# Patient Record
Sex: Female | Born: 1995 | Race: Black or African American | Hispanic: No | Marital: Single | Smoking: Never smoker
Health system: Southern US, Community
[De-identification: ages and names within clinical notes are randomized; demographics above are authoritative.]

## PROBLEM LIST (undated history)

## (undated) DIAGNOSIS — G43909 Migraine, unspecified, not intractable, without status migrainosus: Secondary | ICD-10-CM

## (undated) DIAGNOSIS — G932 Benign intracranial hypertension: Secondary | ICD-10-CM

## (undated) DIAGNOSIS — B977 Papillomavirus as the cause of diseases classified elsewhere: Secondary | ICD-10-CM

## (undated) HISTORY — DX: Benign intracranial hypertension: G93.2

## (undated) HISTORY — PX: DILATION AND CURETTAGE OF UTERUS: SHX78

## (undated) HISTORY — PX: KNEE SURGERY: SHX244

## (undated) HISTORY — DX: Papillomavirus as the cause of diseases classified elsewhere: B97.7

---
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
## 2015-04-05 ENCOUNTER — Emergency Department (HOSPITAL_COMMUNITY)
Admission: EM | Admit: 2015-04-05 | Discharge: 2015-04-05 | Disposition: A | Discharge status: Home / Self Care | Payer: BLUE CROSS/BLUE SHIELD | Attending: Emergency Medicine | Admitting: Emergency Medicine

## 2015-04-05 ENCOUNTER — Emergency Department (HOSPITAL_COMMUNITY): Payer: BLUE CROSS/BLUE SHIELD

## 2015-04-05 ENCOUNTER — Encounter (HOSPITAL_COMMUNITY): Payer: Self-pay

## 2015-04-05 DIAGNOSIS — R1013 Epigastric pain: Secondary | ICD-10-CM | POA: Diagnosis present

## 2015-04-05 DIAGNOSIS — Z3202 Encounter for pregnancy test, result negative: Secondary | ICD-10-CM | POA: Diagnosis not present

## 2015-04-05 DIAGNOSIS — A084 Viral intestinal infection, unspecified: Secondary | ICD-10-CM | POA: Diagnosis not present

## 2015-04-05 DIAGNOSIS — Z791 Long term (current) use of non-steroidal anti-inflammatories (NSAID): Secondary | ICD-10-CM | POA: Insufficient documentation

## 2015-04-05 DIAGNOSIS — Z880 Allergy status to penicillin: Secondary | ICD-10-CM | POA: Diagnosis not present

## 2015-04-05 DIAGNOSIS — R109 Unspecified abdominal pain: Secondary | ICD-10-CM

## 2015-04-05 LAB — COMPREHENSIVE METABOLIC PANEL
ALK PHOS: 84 U/L (ref 38–126)
ALT: 18 U/L (ref 14–54)
AST: 22 U/L (ref 15–41)
Albumin: 3.9 g/dL (ref 3.5–5.0)
Anion gap: 8 (ref 5–15)
BUN: 10 mg/dL (ref 6–20)
CHLORIDE: 107 mmol/L (ref 101–111)
CO2: 23 mmol/L (ref 22–32)
CREATININE: 0.69 mg/dL (ref 0.44–1.00)
Calcium: 9.3 mg/dL (ref 8.9–10.3)
GFR calc Af Amer: >60 mL/min (ref >60)
Glucose, Bld: 111 mg/dL — ABNORMAL HIGH (ref 65–99)
Potassium: 3.8 mmol/L (ref 3.5–5.1)
Sodium: 138 mmol/L (ref 135–145)
Total Bilirubin: 0.6 mg/dL (ref 0.3–1.2)
Total Protein: 8.1 g/dL (ref 6.5–8.1)

## 2015-04-05 LAB — I-STAT BETA HCG BLOOD, ED (MC, WL, AP ONLY)

## 2015-04-05 LAB — CBC
HCT: 34.6 % — ABNORMAL LOW (ref 36.0–46.0)
Hemoglobin: 12 g/dL (ref 12.0–15.0)
MCH: 29.9 pg (ref 26.0–34.0)
MCHC: 34.7 g/dL (ref 30.0–36.0)
MCV: 86.1 fL (ref 78.0–100.0)
PLATELETS: 335 10*3/uL (ref 150–400)
RBC: 4.02 MIL/uL (ref 3.87–5.11)
RDW: 11.8 % (ref 11.5–15.5)
WBC: 16.8 10*3/uL — AB (ref 4.0–10.5)

## 2015-04-05 LAB — URINALYSIS, ROUTINE W REFLEX MICROSCOPIC
BILIRUBIN URINE: NEGATIVE
Glucose, UA: NEGATIVE mg/dL
HGB URINE DIPSTICK: NEGATIVE
Ketones, ur: NEGATIVE mg/dL
Nitrite: NEGATIVE
PROTEIN: NEGATIVE mg/dL
Specific Gravity, Urine: 1.022 (ref 1.005–1.030)
pH: 6 (ref 5.0–8.0)

## 2015-04-05 LAB — URINE MICROSCOPIC-ADD ON: RBC / HPF: NONE SEEN RBC/hpf (ref 0–5)

## 2015-04-05 LAB — LIPASE, BLOOD: LIPASE: 21 U/L (ref 11–51)

## 2015-04-05 MED ORDER — ONDANSETRON 8 MG PO TBDP: ORAL_TABLET | ORAL | Status: DC

## 2015-04-05 MED ORDER — ONDANSETRON HCL 4 MG/2ML IJ SOLN
4.0000 mg | Freq: Once | INTRAMUSCULAR | Status: AC
  Administered 2015-04-05: 4 mg via INTRAVENOUS
  Filled 2015-04-05: qty 2

## 2015-04-05 MED ORDER — ONDANSETRON 4 MG PO TBDP
4.0000 mg | ORAL_TABLET | Freq: Once | ORAL | Status: AC | PRN
  Administered 2015-04-05: 4 mg via ORAL
  Filled 2015-04-05: qty 1

## 2015-04-05 MED ORDER — KETOROLAC TROMETHAMINE 30 MG/ML IJ SOLN
30.0000 mg | Freq: Once | INTRAMUSCULAR | Status: AC
  Administered 2015-04-05: 30 mg via INTRAVENOUS
  Filled 2015-04-05: qty 1

## 2015-04-05 MED ORDER — SODIUM CHLORIDE 0.9 % IV BOLUS (SEPSIS)
1000.0000 mL | Freq: Once | INTRAVENOUS | Status: AC
  Administered 2015-04-05: 1000 mL via INTRAVENOUS

## 2015-04-05 NOTE — ED Provider Notes (Signed)
CSN: [id number]     Arrival date & time 04/05/15  0123 History  By signing my name below, I, Marisue HumbleMichelle Chaffee, attest that this documentation has been prepared under the direction and in the presence of Geoffery Lyonsouglas Abbegail Matuska, MD . Electronically Signed: Marisue HumbleMichelle Chaffee, Scribe. 04/05/2015. 3:20 AM.   Chief Complaint  Patient presents with  . Nausea  . Emesis  . Abdominal Pain   The history is provided by the patient. No language interpreter was used.   HPI Comments:  Tiffany [name] is a 20 y.o. female with no pertinent PMHx who presents to the Emergency Department complaining of worsening epigastric pain and non-bloody emesis onset yesterday morning. Pt states she ate at [employer] and then started feeling bad. She returned from spring break two days ago. No alleviating factors noted or treatments attempted PTA. Pt reports sick contact with similar symptoms. Pt denies diarrhea, abdominal surgeries, or any major medical problems.  History reviewed. No pertinent past medical history. History reviewed. No pertinent past surgical history. No family history on file. Social History  Substance Use Topics  . Smoking status: Never Smoker   . Smokeless tobacco: None  . Alcohol Use: No   OB History    No data available     Review of Systems  Constitutional: Negative for fever.  Gastrointestinal: Positive for nausea, vomiting and abdominal pain. Negative for diarrhea.  All other systems reviewed and are negative.  Allergies  Penicillins  Home Medications   Prior to Admission medications   Medication Sig Start Date End Date Taking? Authorizing Provider  diclofenac (CATAFLAM) 50 MG tablet Take 1 tablet (50 mg total) by mouth 3 (three) times daily. One tablet TID with food prn pain. 10/05/14   Hayden Rasmussenavid Mabe, NP   BP 149/109 mmHg  Pulse 90  Temp(Src) 98.3 F (36.8 C) (Oral)  Resp 16  SpO2 100% Physical Exam  Constitutional: She appears well-developed and well-nourished. No distress.   HENT:  Head: Normocephalic and atraumatic.  Mouth/Throat: Oropharynx is clear and moist. No oropharyngeal exudate.  Eyes: Conjunctivae and EOM are normal. Pupils are equal, round, and reactive to light. Right eye exhibits no discharge. Left eye exhibits no discharge. No scleral icterus.  Neck: Normal range of motion. Neck supple. No JVD present. No thyromegaly present.  Cardiovascular: Normal rate, regular rhythm, normal heart sounds and intact distal pulses.  Exam reveals no gallop and no friction rub.   No murmur heard. Pulmonary/Chest: Effort normal and breath sounds normal. No respiratory distress. She has no wheezes. She has no rales.  Abdominal: Soft. Bowel sounds are normal. She exhibits no distension and no mass. There is tenderness. There is no rebound and no guarding.  TTP of epigastric region as well as RUQ and LUQ.  Musculoskeletal: Normal range of motion. She exhibits no edema or tenderness.  Lymphadenopathy:    She has no cervical adenopathy.  Neurological: She is alert. Coordination normal.  Skin: Skin is warm and dry. No rash noted. No erythema.  Psychiatric: She has a normal mood and affect. Her behavior is normal.  Nursing note and vitals reviewed.  ED Course  Procedures  DIAGNOSTIC STUDIES:  Oxygen Saturation is 100% on RA, normal by my interpretation.    COORDINATION OF CARE:  3:05 AM Will administer fluids, nausea medication, and anti-inflammatory. Will order US to r/o gallstones. Discussed treatment plan with pt at bedside and pt agreed to plan.  Labs Review Labs Reviewed  COMPREHENSIVE METABOLIC PANEL - Abnormal; Notable for the  following:    Glucose, Bld 111 (*)    All other components within normal limits  CBC - Abnormal; Notable for the following:    WBC 16.8 (*)    HCT 34.6 (*)    All other components within normal limits  LIPASE, BLOOD  URINALYSIS, ROUTINE W REFLEX MICROSCOPIC (NOT AT [facility])  I-STAT BETA HCG BLOOD, ED ([facility], [facility], [facility] ONLY)    Imaging  Review No results found. I have personally reviewed and evaluated these images and lab results as part of my medical decision-making.    MDM   Final diagnoses:  None    Patient presents with nausea, vomiting, and upper abdominal pain that started earlier today. Her presentation, exam, and workup are most consistent with a viral gastroenteritis. Ultrasound fails to reveal gallstones or other pathology. She is feeling better after fluids and medications and will be discharged to home.  I personally performed the services described in this documentation, which was scribed in my presence. The recorded information has been reviewed and is accurate.       Geoffery Lyons, MD 04/05/15 [phone]

## 2015-04-05 NOTE — ED Notes (Signed)
Bed: WA05 Expected date:  Expected time:  Means of arrival:  Comments: EMS 

## 2015-04-05 NOTE — Discharge Instructions (Signed)
Zofran as prescribed as needed for nausea.  Return to the emergency department if you develop worsening abdominal pain, high fever, bloody stool, or other new and concerning symptoms.   Viral Gastroenteritis Viral gastroenteritis is also known as stomach flu. This condition affects the stomach and intestinal tract. It can cause sudden diarrhea and vomiting. The illness typically lasts 3 to 8 days. Most people develop an immune response that eventually gets rid of the virus. While this natural response develops, the virus can make you quite ill. CAUSES  Many different viruses can cause gastroenteritis, such as rotavirus or noroviruses. You can catch one of these viruses by consuming contaminated food or water. You may also catch a virus by sharing utensils or other personal items with an infected person or by touching a contaminated surface. SYMPTOMS  The most common symptoms are diarrhea and vomiting. These problems can cause a severe loss of body fluids (dehydration) and a body salt (electrolyte) imbalance. Other symptoms may include:  Fever.  Headache.  Fatigue.  Abdominal pain. DIAGNOSIS  Your caregiver can usually diagnose viral gastroenteritis based on your symptoms and a physical exam. A stool sample may also be taken to test for the presence of viruses or other infections. TREATMENT  This illness typically goes away on its own. Treatments are aimed at rehydration. The most serious cases of viral gastroenteritis involve vomiting so severely that you are not able to keep fluids down. In these cases, fluids must be given through an intravenous line (IV). HOME CARE INSTRUCTIONS   Drink enough fluids to keep your urine clear or pale yellow. Drink small amounts of fluids frequently and increase the amounts as tolerated.  Ask your caregiver for specific rehydration instructions.  Avoid:  Foods high in sugar.  Alcohol.  Carbonated drinks.  Tobacco.  Juice.  Caffeine  drinks.  Extremely hot or cold fluids.  Fatty, greasy foods.  Too much intake of anything at one time.  Dairy products until 24 to 48 hours after diarrhea stops.  You may consume probiotics. Probiotics are active cultures of beneficial bacteria. They may lessen the amount and number of diarrheal stools in adults. Probiotics can be found in yogurt with active cultures and in supplements.  Wash your hands well to avoid spreading the virus.  Only take over-the-counter or prescription medicines for pain, discomfort, or fever as directed by your caregiver. Do not give aspirin to children. Antidiarrheal medicines are not recommended.  Ask your caregiver if you should continue to take your regular prescribed and over-the-counter medicines.  Keep all follow-up appointments as directed by your caregiver. SEEK IMMEDIATE MEDICAL CARE IF:   You are unable to keep fluids down.  You do not urinate at least once every 6 to 8 hours.  You develop shortness of breath.  You notice blood in your stool or vomit. This may look like coffee grounds.  You have abdominal pain that increases or is concentrated in one small area (localized).  You have persistent vomiting or diarrhea.  You have a fever.  The patient is a child younger than 3 months, and he or she has a fever.  The patient is a child older than 3 months, and he or she has a fever and persistent symptoms.  The patient is a child older than 3 months, and he or she has a fever and symptoms suddenly get worse.  The patient is a baby, and he or she has no tears when crying. MAKE SURE YOU:   Understand  these instructions.  Will watch your condition.  Will get help right away if you are not doing well or get worse.   This information is not intended to replace advice given to you by your health care provider. Make sure you discuss any questions you have with your health care provider.   Document Released: 01/02/2005 Document Revised:  03/27/2011 Document Reviewed: 10/19/2010 Elsevier Interactive Patient Education [employer]

## 2015-04-05 NOTE — ED Notes (Signed)
Pt c/o N/V and abdominal pain that she suspects might be secondary to food poisoning.

## 2016-03-22 ENCOUNTER — Encounter (HOSPITAL_COMMUNITY): Payer: Self-pay

## 2016-03-22 ENCOUNTER — Inpatient Hospital Stay (HOSPITAL_COMMUNITY)
Admission: AD | Admit: 2016-03-22 | Discharge: 2016-03-22 | Disposition: A | Discharge status: Home / Self Care | Payer: No Typology Code available for payment source | Source: Ambulatory Visit | Attending: Obstetrics & Gynecology | Admitting: Obstetrics & Gynecology

## 2016-03-22 DIAGNOSIS — R109 Unspecified abdominal pain: Secondary | ICD-10-CM | POA: Diagnosis present

## 2016-03-22 DIAGNOSIS — Z3202 Encounter for pregnancy test, result negative: Secondary | ICD-10-CM | POA: Diagnosis not present

## 2016-03-22 DIAGNOSIS — R102 Pelvic and perineal pain: Secondary | ICD-10-CM | POA: Diagnosis not present

## 2016-03-22 DIAGNOSIS — R112 Nausea with vomiting, unspecified: Secondary | ICD-10-CM | POA: Insufficient documentation

## 2016-03-22 DIAGNOSIS — Z880 Allergy status to penicillin: Secondary | ICD-10-CM | POA: Insufficient documentation

## 2016-03-22 LAB — URINALYSIS, ROUTINE W REFLEX MICROSCOPIC
BACTERIA UA: NONE SEEN
BILIRUBIN URINE: NEGATIVE
Glucose, UA: NEGATIVE mg/dL
Hgb urine dipstick: NEGATIVE
KETONES UR: NEGATIVE mg/dL
LEUKOCYTES UA: NEGATIVE
Nitrite: NEGATIVE
PH: 6 (ref 5.0–8.0)
PROTEIN: 30 mg/dL — AB
Specific Gravity, Urine: 1.027 (ref 1.005–1.030)

## 2016-03-22 LAB — POCT PREGNANCY, URINE: Preg Test, Ur: NEGATIVE

## 2016-03-22 MED ORDER — ONDANSETRON HCL 4 MG PO TABS: 4.0000 mg | ORAL_TABLET | Freq: Four times a day (QID) | ORAL | 0 refills | Status: DC

## 2016-03-22 MED ORDER — IBUPROFEN 600 MG PO TABS: 600.0000 mg | ORAL_TABLET | Freq: Four times a day (QID) | ORAL | 0 refills | Status: DC | PRN

## 2016-03-22 NOTE — MAU Provider Note (Signed)
History     CSN: [id number]  Arrival date and time: 03/22/16 1532   First Provider Initiated Contact with Patient 03/22/16 [phone]      Chief Complaint  Patient presents with  . Abdominal Pain   HPI Tiffany [name] is a 21 y.o. who presents to MAU today with complaint of abdominal pain. She had Kyleena placed on Monday in her PCP office in [place]. She was also diagnosed with BV and given Rx. She states frequent N/V since then. She denies fever. She has taken Ibuprofen intermittently with some relief. She states occasional spotting, but none currently.    OB History    No data available      History reviewed. No pertinent past medical history.  History reviewed. No pertinent surgical history.  History reviewed. No pertinent family history.  Social History  Substance Use Topics  . Smoking status: Never Smoker  . Smokeless tobacco: Never Used  . Alcohol use No    Allergies:  Allergies  Allergen Reactions  . Penicillins Other (See Comments)    Has patient had a PCN reaction causing immediate rash, facial/tongue/throat swelling, SOB or lightheadedness with hypotension:unsure Has patient had a PCN reaction causing severe rash involving mucus membranes or skin necrosis:unsure Has patient had a PCN reaction that required hospitalization:unsure Has patient had a PCN reaction occurring within the last 10 years:No Childhood Reaction If all of the above answers are "NO", then may proceed with Cephalosporin use.     No prescriptions prior to admission.    Review of Systems  Constitutional: Negative for fever.  Gastrointestinal: Positive for abdominal pain, nausea and vomiting. Negative for constipation and diarrhea.  Genitourinary: Positive for vaginal bleeding and vaginal discharge.   Physical Exam   Blood pressure 125/83, pulse 98, resp. rate 18.  Physical Exam  Nursing note and vitals reviewed. Constitutional: She is oriented to person, place, and  time. She appears well-developed and well-nourished. No distress.  HENT:  Head: Normocephalic and atraumatic.  Cardiovascular: Normal rate.   Respiratory: Effort normal.  GI: Soft. She exhibits no distension and no mass. There is tenderness (mild suprapubic). There is no rebound and no guarding.  Genitourinary: Uterus is not enlarged and not tender. Cervix exhibits no motion tenderness, no discharge and no friability. Right adnexum displays no mass and no tenderness. Left adnexum displays no mass and no tenderness. No bleeding in the vagina. Vaginal discharge (moderate thick, white discharge noted) found.    Neurological: She is alert and oriented to person, place, and time.  Skin: Skin is warm and dry. No erythema.  Psychiatric: She has a normal mood and affect.    Results for orders placed or performed during the hospital encounter of 03/22/16 (from the past 24 hour(s))  Urinalysis, Routine w reflex microscopic     Status: Abnormal   Collection Time: 03/22/16  3:43 PM  Result Value Ref Range   Color, Urine YELLOW YELLOW   APPearance CLEAR CLEAR   Specific Gravity, Urine 1.027 1.005 - 1.030   pH 6.0 5.0 - 8.0   Glucose, UA NEGATIVE NEGATIVE mg/dL   Hgb urine dipstick NEGATIVE NEGATIVE   Bilirubin Urine NEGATIVE NEGATIVE   Ketones, ur NEGATIVE NEGATIVE mg/dL   Protein, ur 30 (A) NEGATIVE mg/dL   Nitrite NEGATIVE NEGATIVE   Leukocytes, UA NEGATIVE NEGATIVE   RBC / HPF 0-5 0 - 5 RBC/hpf   WBC, UA 6-30 0 - 5 WBC/hpf   Bacteria, UA NONE SEEN NONE SEEN  Squamous Epithelial / LPF 0-5 (A) NONE SEEN   Mucous PRESENT   Pregnancy, urine POC     Status: None   Collection Time: 03/22/16  3:49 PM  Result Value Ref Range   Preg Test, Ur NEGATIVE NEGATIVE    MAU Course  Procedures None  MDM UPT - negative UA today  Discussed with patient options to treat GI symptoms and pelvic pain and give IUD a longer trial to see if pain will resolve after GI symptoms improve. Patient would like  to wait to see if symptoms improve.   Assessment and Plan  A: Pelvic pain Nausea and vomiting   P: Discharge home Rx for Zofran and Ibuprofen given to patient  Warning signs for worsening condition discussed Advised to take Zofran prior to Flagyl and take Flagyl with food to avoid N/V Patient advised to follow-up with PCP in [place] if symptoms persist Patient may return to MAU as needed or if her condition were to change or worsen   Marny LowensteinJulie N Wenzel, PA-C  03/22/2016, 5:02 PM

## 2016-03-22 NOTE — MAU Note (Signed)
Pt had an IUD placed Monday at her doctors office and has been hurting since. States her "pain in crucial". States it's intermittent and at time is a 10/10. Hurst worse when standing. When laying it's not bothering her as much. Had some small spotting today when she wiped.

## 2016-03-22 NOTE — Discharge Instructions (Signed)
Bacterial Vaginosis Bacterial vaginosis is an infection of the vagina. It happens when too many germs (bacteria) grow in the vagina. This infection puts you at risk for infections from sex (STIs). Treating this infection can lower your risk for some STIs. You should also treat this if you are pregnant. It can cause your baby to be born early. Follow these instructions at home: Medicines   Take over-the-counter and prescription medicines only as told by your doctor.  Take or use your antibiotic medicine as told by your doctor. Do not stop taking or using it even if you start to feel better. General instructions   If you your sexual partner is a woman, tell her that you have this infection. She needs to get treatment if she has symptoms. If you have a female partner, he does not need to be treated.  During treatment:  Avoid sex.  Do not douche.  Avoid alcohol as told.  Avoid breastfeeding as told.  Drink enough fluid to keep your pee (urine) clear or pale yellow.  Keep your vagina and butt (rectum) clean.  Wash the area with warm water every day.  Wipe from front to back after you use the toilet.  Keep all follow-up visits as told by your doctor. This is important. Preventing this condition   Do not douche.  Use only warm water to wash around your vagina.  Use protection when you have sex. This includes:  Latex condoms.  Dental dams.  Limit how many people you have sex with. It is best to only have sex with the same person (be monogamous).  Get tested for STIs. Have your partner get tested.  Wear underwear that is cotton or lined with cotton.  Avoid tight pants and pantyhose. This is most important in summer.  Do not use any products that have nicotine or tobacco in them. These include cigarettes and e-cigarettes. If you need help quitting, ask your doctor.  Do not use illegal drugs.  Limit how much alcohol you drink. Contact a doctor if:  Your symptoms do not  get better, even after you are treated.  You have more discharge or pain when you pee (urinate).  You have a fever.  You have pain in your belly (abdomen).  You have pain with sex.  Your bleed from your vagina between periods. Summary  This infection happens when too many germs (bacteria) grow in the vagina.  Treating this condition can lower your risk for some infections from sex (STIs).  You should also treat this if you are pregnant. It can cause early (premature) birth.  Do not stop taking or using your antibiotic medicine even if you start to feel better. This information is not intended to replace advice given to you by your health care provider. Make sure you discuss any questions you have with your health care provider. Document Released: 10/12/2007 Document Revised: 09/18/2015 Document Reviewed: 09/18/2015 Elsevier Interactive Patient Education  2017 [employer].   Copyright  VHI. All rights reserved.  Intrauterine Device Insertion, Care After This sheet gives you information about how to care for yourself after your procedure. Your health care provider may also give you more specific instructions. If you have problems or questions, contact your health care provider. What can I expect after the procedure? After the procedure, it is common to have:  Cramps and pain in the abdomen.  Light bleeding (spotting) or heavier bleeding that is like your menstrual period. This may last for up to a  few days.  Lower back pain.  Dizziness.  Headaches.  Nausea. Follow these instructions at home:  Before resuming sexual activity, check to make sure that you can feel the IUD string(s). You should be able to feel the end of the string(s) below the opening of your cervix. If your IUD string is in place, you may resume sexual activity.  If you had a hormonal IUD inserted more than 7 days after your most recent period started, you will need to use a backup method of birth control  for 7 days after IUD insertion. Ask your health care provider whether this applies to you.  Continue to check that the IUD is still in place by feeling for the string(s) after every menstrual period, or once a month.  Take over-the-counter and prescription medicines only as told by your health care provider.  Do not drive or use heavy machinery while taking prescription pain medicine.  Keep all follow-up visits as told by your health care provider. This is important. Contact a health care provider if:  You have bleeding that is heavier or lasts longer than a normal menstrual cycle.  You have a fever.  You have cramps or abdominal pain that get worse or do not get better with medicine.  You develop abdominal pain that is new or is not in the same area of earlier cramping and pain.  You feel lightheaded or weak.  You have abnormal or bad-smelling discharge from your vagina.  You have pain during sexual activity.  You have any of the following problems with your IUD string(s):  The string bothers or hurts you or your sexual partner.  You cannot feel the string.  The string has gotten longer.  You can feel the IUD in your vagina.  You think you may be pregnant, or you miss your menstrual period.  You think you may have an STI (sexually transmitted infection). Get help right away if:  You have flu-like symptoms.  You have a fever and chills.  You can feel that your IUD has slipped out of place. Summary  After the procedure, it is common to have cramps and pain in the abdomen. It is also common to have light bleeding (spotting) or heavier bleeding that is like your menstrual period.  Continue to check that the IUD is still in place by feeling for the string(s) after every menstrual period, or once a month.  Keep all follow-up visits as told by your health care provider. This is important.  Contact your health care provider if you have problems with your IUD string(s),  such as the string getting longer or bothering you or your sexual partner. This information is not intended to replace advice given to you by your health care provider. Make sure you discuss any questions you have with your health care provider. Document Released: 08/31/2010 Document Revised: 11/24/2015 Document Reviewed: 11/24/2015 Elsevier Interactive Patient Education  2017 [employer]

## 2016-04-21 ENCOUNTER — Inpatient Hospital Stay (HOSPITAL_COMMUNITY)
Admission: AD | Admit: 2016-04-21 | Discharge: 2016-04-21 | Disposition: A | Discharge status: Home / Self Care | Payer: No Typology Code available for payment source | Source: Ambulatory Visit | Attending: Obstetrics & Gynecology | Admitting: Obstetrics & Gynecology

## 2016-04-21 ENCOUNTER — Encounter (HOSPITAL_COMMUNITY): Payer: Self-pay

## 2016-04-21 DIAGNOSIS — Z975 Presence of (intrauterine) contraceptive device: Secondary | ICD-10-CM | POA: Insufficient documentation

## 2016-04-21 DIAGNOSIS — N939 Abnormal uterine and vaginal bleeding, unspecified: Secondary | ICD-10-CM | POA: Diagnosis not present

## 2016-04-21 DIAGNOSIS — N898 Other specified noninflammatory disorders of vagina: Secondary | ICD-10-CM | POA: Diagnosis present

## 2016-04-21 DIAGNOSIS — Z880 Allergy status to penicillin: Secondary | ICD-10-CM | POA: Diagnosis not present

## 2016-04-21 LAB — URINALYSIS, ROUTINE W REFLEX MICROSCOPIC
Bilirubin Urine: NEGATIVE
GLUCOSE, UA: NEGATIVE mg/dL
HGB URINE DIPSTICK: NEGATIVE
KETONES UR: NEGATIVE mg/dL
LEUKOCYTES UA: NEGATIVE
Nitrite: NEGATIVE
PROTEIN: NEGATIVE mg/dL
Specific Gravity, Urine: 1.018 (ref 1.005–1.030)
pH: 6 (ref 5.0–8.0)

## 2016-04-21 LAB — WET PREP, GENITAL
Clue Cells Wet Prep HPF POC: NONE SEEN
Sperm: NONE SEEN
Trich, Wet Prep: NONE SEEN
Yeast Wet Prep HPF POC: NONE SEEN

## 2016-04-21 LAB — POCT PREGNANCY, URINE: Preg Test, Ur: NEGATIVE

## 2016-04-21 NOTE — Discharge Instructions (Signed)

## 2016-04-21 NOTE — MAU Provider Note (Signed)
History     CSN: [id number]  Arrival date and time: 04/21/16 1008   First Provider Initiated Contact with Patient 04/21/16 1030      Chief Complaint  Patient presents with  . Vaginal Bleeding  . Vaginal Discharge   Tiffany [name] is a 21 y.o. G0P0 presenting with passing clots and vaginal discharge. She had Kyleena IUD inserted 1 month ago in [place] at which time she had limited bleeding and cramping. She was counseled that she may have some spotting or bleeding but is concerned that she's been passing small clots seen only when she wipes for a week. Denies menstrual-like blood flow. Was previously on Depo-Provera and amenorrheic prior to IUD insertion. She also reports heavy vaginal discharge that is non-irritative. Was diagnosed with BV at the time of IUD insertion. She is also concerned her sexual partner may have been with other partners, so requests repeat GC chlamydia tests which were negative a month ago. Declines HIV test.   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        History reviewed. No pertinent past medical history.  History reviewed. No pertinent surgical history.  History reviewed. No pertinent family history.  Social History  Substance Use Topics  . Smoking status: Never Smoker  . Smokeless tobacco: Never Used  . Alcohol use No    Allergies:  Allergies  Allergen Reactions  . Penicillins Other (See Comments)    Has patient had a PCN reaction causing immediate rash, facial/tongue/throat swelling, SOB or lightheadedness with hypotension:unsure Has patient had a PCN reaction causing severe rash involving mucus membranes or skin necrosis:unsure Has patient had a PCN reaction that required hospitalization:unsure Has patient had a PCN reaction occurring within the last 10 years:No Childhood Reaction If all of the above answers are "NO", then may proceed with Cephalosporin use.     Prescriptions Prior to  Admission  Medication Sig Dispense Refill Last Dose  . ibuprofen (ADVIL,MOTRIN) 600 MG tablet Take 1 tablet (600 mg total) by mouth every 6 (six) hours as needed. 30 tablet 0   . ondansetron (ZOFRAN) 4 MG tablet Take 1 tablet (4 mg total) by mouth every 6 (six) hours. 12 tablet 0     Review of Systems  Constitutional: Negative for fever.  Gastrointestinal: Negative for abdominal pain, anal bleeding, diarrhea and nausea.  Genitourinary: Negative for dysuria.  Musculoskeletal: Negative for back pain.   Physical Exam   Blood pressure (!) 145/84, temperature 98.2 F (36.8 C), temperature source Oral, resp. rate 18, height  (1.753 m), weight 117.9 kg (260 lb).  Physical Exam  Nursing note and vitals reviewed. Constitutional: She is oriented to person, place, and time. She appears well-developed and well-nourished. No distress.  HENT:  Head: Normocephalic.  Eyes: Pupils are equal, round, and reactive to light.  Neck: Normal range of motion.  Cardiovascular: Normal rate.   Respiratory: Effort normal.  GI: Soft. There is no tenderness.  Genitourinary:  Genitourinary Comments: Speculum exam: NEFG; vagina pink with small amount homogenous white discharge in vault; cervix mid position nulliparous, no lesions noted, IUD strings protrude 1.5 cm from external os  Bimanual: No CMT; unable to outline uterus due to body habitus, nontender, no adnexal tenderness or masses noted    Musculoskeletal: Normal range of motion.  Neurological: She is alert and oriented to person, place, and time.  Skin: Skin is warm and dry.  Psychiatric: She has a normal mood and affect. Her behavior  is normal. Thought content normal.    MAU Course  Procedures Results for orders placed or performed during the hospital encounter of 04/21/16 (from the past 24 hour(s))  Urinalysis, Routine w reflex microscopic     Status: None   Collection Time: 04/21/16 10:15 AM  Result Value Ref Range   Color, Urine YELLOW  YELLOW   APPearance CLEAR CLEAR   Specific Gravity, Urine 1.018 1.005 - 1.030   pH 6.0 5.0 - 8.0   Glucose, UA NEGATIVE NEGATIVE mg/dL   Hgb urine dipstick NEGATIVE NEGATIVE   Bilirubin Urine NEGATIVE NEGATIVE   Ketones, ur NEGATIVE NEGATIVE mg/dL   Protein, ur NEGATIVE NEGATIVE mg/dL   Nitrite NEGATIVE NEGATIVE   Leukocytes, UA NEGATIVE NEGATIVE  Pregnancy, urine POC     Status: None   Collection Time: 04/21/16 10:25 AM  Result Value Ref Range   Preg Test, Ur NEGATIVE NEGATIVE  Wet prep, genital     Status: Abnormal   Collection Time: 04/21/16 10:40 AM  Result Value Ref Range   Yeast Wet Prep HPF POC NONE SEEN NONE SEEN   Trich, Wet Prep NONE SEEN NONE SEEN   Clue Cells Wet Prep HPF POC NONE SEEN NONE SEEN   WBC, Wet Prep HPF POC MANY (A) NONE SEEN   Sperm NONE SEEN      Assessment and Plan  IUD (intrauterine device) in place  Vaginal discharge  Reassured patient of above. Reviewed normalcy of typical bleeding with IUD and reviewed safe sex.  Allergies as of 04/21/2016      Reactions   Penicillins Other (See Comments)   Has patient had a PCN reaction causing immediate rash, facial/tongue/throat swelling, SOB or lightheadedness with hypotension:unsure Has patient had a PCN reaction causing severe rash involving mucus membranes or skin necrosis:unsure Has patient had a PCN reaction that required hospitalization:unsure Has patient had a PCN reaction occurring within the last 10 years:No Childhood Reaction If all of the above answers are "NO", then may proceed with Cephalosporin use.      Medication List    STOP taking these medications   ondansetron 4 MG tablet Commonly known as:  ZOFRAN     TAKE these medications   ibuprofen 600 MG tablet Commonly known as:  ADVIL,MOTRIN Take 1 tablet (600 mg total) by mouth every 6 (six) hours as needed.      Follow up with PCP in [facility] or [facility] as needed   Pratik Dalziel 04/21/2016, 10:47 AM

## 2016-04-21 NOTE — MAU Note (Signed)
Patient had Tiffany [name] (1 month ago in [facility]), passing big blood clots and having vaginal discharge.

## 2016-04-24 LAB — GC/CHLAMYDIA PROBE AMP (~~LOC~~) NOT AT ARMC
Chlamydia: NEGATIVE
NEISSERIA GONORRHEA: NEGATIVE

## 2016-04-26 ENCOUNTER — Telehealth (HOSPITAL_COMMUNITY): Payer: Self-pay

## 2016-04-26 ENCOUNTER — Telehealth: Payer: Self-pay | Admitting: Family Medicine

## 2016-04-26 NOTE — Telephone Encounter (Signed)
Called patient back, states she is still having the same discharge/bleeding. Discussed results with the patient again, no new symptoms of F/C or abdominal pain. Discussed with patient importance of following up with provider who placed IUD. Discussed what is normal and abnormal after IUD placement. Discussed when appropriate time to return to ED for symptoms. Patient verbalized understanding.  Cleda Clarks, DO  OB Fellow

## 2016-04-26 NOTE — Telephone Encounter (Signed)
Pt had questions about STD testing done at her last visit. Results were negative. She stated she was still having the same symptoms as when she was seen at her visit. I told her if she thought she needed to be seen, then she could come in. She was not filling more than 1 pad in an hour. Told her I would have a provider call her at their earliest convenience; they were taking care of patients and I was not positive when they would have a break to call her back.

## 2016-04-26 NOTE — Telephone Encounter (Signed)
Called patient

## 2016-05-04 ENCOUNTER — Inpatient Hospital Stay (HOSPITAL_COMMUNITY)
Admission: AD | Admit: 2016-05-04 | Discharge: 2016-05-04 | Discharge status: Against Medical Advice | Payer: No Typology Code available for payment source | Source: Ambulatory Visit | Attending: Obstetrics and Gynecology | Admitting: Obstetrics and Gynecology

## 2016-05-04 NOTE — MAU Note (Signed)
Not in lobby x 3  

## 2016-05-04 NOTE — MAU Note (Signed)
Not in lobby x2.

## 2016-05-04 NOTE — MAU Note (Signed)
Not in lobby x1  

## 2016-09-03 ENCOUNTER — Emergency Department (HOSPITAL_COMMUNITY)
Admission: EM | Admit: 2016-09-03 | Discharge: 2016-09-03 | Disposition: A | Discharge status: Home / Self Care | Payer: No Typology Code available for payment source | Attending: Emergency Medicine | Admitting: Emergency Medicine

## 2016-09-03 ENCOUNTER — Emergency Department (HOSPITAL_COMMUNITY): Payer: No Typology Code available for payment source

## 2016-09-03 ENCOUNTER — Encounter (HOSPITAL_COMMUNITY): Payer: Self-pay | Admitting: Emergency Medicine

## 2016-09-03 DIAGNOSIS — R0789 Other chest pain: Secondary | ICD-10-CM | POA: Insufficient documentation

## 2016-09-03 DIAGNOSIS — R079 Chest pain, unspecified: Secondary | ICD-10-CM

## 2016-09-03 LAB — BASIC METABOLIC PANEL
ANION GAP: 7 (ref 5–15)
BUN: 8 mg/dL (ref 6–20)
CHLORIDE: 104 mmol/L (ref 101–111)
CO2: 24 mmol/L (ref 22–32)
Calcium: 9.1 mg/dL (ref 8.9–10.3)
Creatinine, Ser: 0.65 mg/dL (ref 0.44–1.00)
GFR calc Af Amer: >60 mL/min (ref >60)
GFR calc non Af Amer: >60 mL/min (ref >60)
Glucose, Bld: 91 mg/dL (ref 65–99)
POTASSIUM: 3.7 mmol/L (ref 3.5–5.1)
Sodium: 135 mmol/L (ref 135–145)

## 2016-09-03 LAB — CBC
HEMATOCRIT: 35 % — AB (ref 36.0–46.0)
HEMOGLOBIN: 11.7 g/dL — AB (ref 12.0–15.0)
MCH: 29.5 pg (ref 26.0–34.0)
MCHC: 33.4 g/dL (ref 30.0–36.0)
MCV: 88.4 fL (ref 78.0–100.0)
Platelets: 400 10*3/uL (ref 150–400)
RBC: 3.96 MIL/uL (ref 3.87–5.11)
RDW: 12 % (ref 11.5–15.5)
WBC: 14.5 10*3/uL — ABNORMAL HIGH (ref 4.0–10.5)

## 2016-09-03 LAB — POCT I-STAT TROPONIN I: Troponin i, poc: 0 ng/mL (ref 0.00–0.08)

## 2016-09-03 LAB — D-DIMER, QUANTITATIVE: D-Dimer, Quant: <0.27 ug/mL-FEU (ref 0.00–0.50)

## 2016-09-03 MED ORDER — IBUPROFEN 600 MG PO TABS: 600.0000 mg | ORAL_TABLET | Freq: Four times a day (QID) | ORAL | 0 refills | Status: DC | PRN

## 2016-09-03 NOTE — ED Triage Notes (Signed)
Pt comes in with complaints of left sided chest pain that has been intermittent over the past few weeks.  States the pain got the worst it has been this morning when she woke up. Denies smoking hx. Does not appear to be in any acute distress at this time.  States the pain is sharp and stabbing.

## 2016-09-03 NOTE — Discharge Instructions (Signed)
Take Ibuprofen three times a day for pain Please follow up with cardiology if your pain continues Return for worsening symptoms

## 2016-09-03 NOTE — ED Provider Notes (Signed)
[facility]-EMERGENCY DEPT Provider Note   CSN: [id number] Arrival date & time: 09/03/16  1558     History   Chief Complaint No chief complaint on file.   HPI Tiffany [name] is a 21 y.o. female who presents with chest pain. No significant past medical history. She states that she has had this pain intermittently for the past several weeks. It got worse yesterday and today therefore he should be evaluated. It is left sided, sharp, stabbing, non-radiating. It will last several hours and resolved on its own. Nothing is making the pain better. She has not tried any medicines. Inspiration makes the pain worse. She's never had this pain before. She denies fever, chills, shortness of breath, cough, palpitations, leg swelling, calf pain, abdominal pain, sweats, nausea or vomiting. She is on oral birth control pills. No recent surgery/travel/immobilization, hx of cancer, leg swelling, hemptysis, prior DVT/PE. She does not smoke or use drugs. She does not know if her family has cardiac disease.   HPI  History reviewed. No pertinent past medical history.  There are no active problems to display for this patient.   Past Surgical History:  Procedure Laterality Date  . KNEE SURGERY      OB History    Gravida Para Term Preterm AB Living   0 0 0 0 0 0   SAB TAB Ectopic Multiple Live Births   0 0 0 0 0       Home Medications    Prior to Admission medications   Medication Sig Start Date End Date Taking? Authorizing Provider  ibuprofen (ADVIL,MOTRIN) 600 MG tablet Take 1 tablet (600 mg total) by mouth every 6 (six) hours as needed. Patient not taking: Reported on 04/21/2016 03/22/16   Marny Lowenstein, PA-C    Family History No family history on file.  Social History Social History  Substance Use Topics  . Smoking status: Never Smoker  . Smokeless tobacco: Never Used  . Alcohol use No     Allergies   Penicillins   Review of Systems Review of Systems  Constitutional:  Negative for chills and fever.  Respiratory: Negative for cough and shortness of breath.   Cardiovascular: Positive for chest pain. Negative for palpitations and leg swelling.  Gastrointestinal: Negative for abdominal pain, nausea and vomiting.  Neurological: Negative for syncope and light-headedness.  All other systems reviewed and are negative.    Physical Exam Updated Vital Signs BP (!) 134/95 (BP Location: Right Arm)   Pulse 91   Temp 98.7 F (37.1 C) (Oral)   Resp 13   Ht 5\' 9"  (1.753 m)   Wt 117.9 kg (260 lb)   LMP 08/27/2016   SpO2 97%   BMI 38.40 kg/m   Physical Exam  Constitutional: She is oriented to person, place, and time. She appears well-developed and well-nourished. No distress.  Calm, cooperative female in NAD  HENT:  Head: Normocephalic and atraumatic.  Eyes: Pupils are equal, round, and reactive to light. Conjunctivae are normal. Right eye exhibits no discharge. Left eye exhibits no discharge. No scleral icterus.  Neck: Normal range of motion.  Cardiovascular: Normal rate and regular rhythm.  Exam reveals no gallop and no friction rub.   No murmur heard. Pulmonary/Chest: Effort normal. No respiratory distress. She has no wheezes. She has no rales. She exhibits no tenderness.  Abdominal: Soft. Bowel sounds are normal. She exhibits no distension and no mass. There is no tenderness. There is no rebound and no guarding. No hernia.  Musculoskeletal:  No calf pain or swelling  Neurological: She is alert and oriented to person, place, and time.  Skin: Skin is warm and dry.  Psychiatric: She has a normal mood and affect. Her behavior is normal.  Nursing note and vitals reviewed.    ED Treatments / Results  Labs (all labs ordered are listed, but only abnormal results are displayed) Labs Reviewed  CBC - Abnormal; Notable for the following:       Result Value   WBC 14.5 (*)    Hemoglobin 11.7 (*)    HCT 35.0 (*)    All other components within normal limits    BASIC METABOLIC PANEL  D-DIMER, QUANTITATIVE (NOT AT [facility])  I-STAT TROPONIN, ED  POCT I-STAT TROPONIN I    EKG  EKG Interpretation  Date/Time:  Sunday September 03 2016 16:05:25 EDT Ventricular Rate:  85 PR Interval:    QRS Duration: 83 QT Interval:  351 QTC Calculation: 418 R Axis:   55 Text Interpretation:  Sinus rhythm Baseline wander in lead(s) V1 Confirmed by Geoffery Lyons ([id number]) on 09/03/2016 5:03:01 PM       Radiology Dg Chest 2 View  Result Date: 09/03/2016 CLINICAL DATA:  Chest pain for 2 weeks.  Hypertension. EXAM: CHEST  2 VIEW COMPARISON:  None. FINDINGS: The cardiac silhouette is mildly enlarged. Mediastinal contours appear intact. There is no evidence of focal airspace consolidation, pleural effusion or pneumothorax. Osseous structures are without acute abnormality. Soft tissues are grossly normal. IMPRESSION: Mildly enlarged cardiac silhouette. No evidence of focal airspace consolidation. Electronically Signed   By: Ted Mcalpine M.D.   On: 09/03/2016 16:59    Procedures Procedures (including critical care time)  Medications Ordered in ED Medications - No data to display   Initial Impression / Assessment and Plan / ED Course  I have reviewed the triage vital signs and the nursing notes.  Pertinent labs & imaging results that were available during my care of the patient were reviewed by me and considered in my medical decision making (see chart for details).  21 year old female with chest pain. Unclear etiology. Chest pain work up is reassuring. Doubt ACS, PE, pericarditis, esophageal rupture, tension pneumothorax, aortic dissection, cardiac tamponade. EKG is NSR. Exam is unremarkable. CXR shows a mildly enlarged cardiac silhouette. Troponin is 0. Labs are remarkable for leukocytosis of 14 and mild anemia. D-dimer is negative. Patient is non-smoker. Will rx Ibuprofen and advised f/u with Cardiology if symptoms persist.   Final Clinical Impressions(s) / ED  Diagnoses   Final diagnoses:  Left sided chest pain    New Prescriptions New Prescriptions   No medications on file     Beryle Quant 09/03/16 2110    Linwood Dibbles, MD 09/04/16 2318

## 2016-11-14 ENCOUNTER — Emergency Department (HOSPITAL_COMMUNITY)
Admission: EM | Admit: 2016-11-14 | Discharge: 2016-11-14 | Disposition: A | Discharge status: Home / Self Care | Payer: No Typology Code available for payment source | Attending: Emergency Medicine | Admitting: Emergency Medicine

## 2016-11-14 ENCOUNTER — Emergency Department (HOSPITAL_COMMUNITY): Payer: No Typology Code available for payment source

## 2016-11-14 ENCOUNTER — Encounter (HOSPITAL_COMMUNITY): Payer: Self-pay | Admitting: Emergency Medicine

## 2016-11-14 DIAGNOSIS — Z79899 Other long term (current) drug therapy: Secondary | ICD-10-CM | POA: Insufficient documentation

## 2016-11-14 DIAGNOSIS — M25562 Pain in left knee: Secondary | ICD-10-CM | POA: Diagnosis not present

## 2016-11-14 MED ORDER — IBUPROFEN 800 MG PO TABS: 800.0000 mg | ORAL_TABLET | Freq: Three times a day (TID) | ORAL | 0 refills | Status: DC | PRN

## 2016-11-14 NOTE — ED Provider Notes (Signed)
[facility]-EMERGENCY DEPT Provider Note   CSN: [id number] Arrival date & time: 11/14/16  1311     History   Chief Complaint Chief Complaint  Patient presents with  . Knee Pain  . Cough    HPI Tiffany [name] is a 21 y.o. female.  The history is provided by the patient and medical records. No language interpreter was used.  Knee Pain   Pertinent negatives include no numbness.  Cough  Pertinent negatives include no chest pain, no chills, no ear pain, no headaches, no sore throat, no shortness of breath, no wheezing and no eye redness.   Tiffany [name] is a 21 y.o. female who presents to the Emergency Department with two complaints:  1. Acute onset of right medial knee pain since last night. Patient states that she was standing up talking with a friend when she stepped oddly on her left leg, losing her balance. She felt knee move inward and twisted, causing pain which was worse upon awakening this morning. No pain when knee is straight and still, but significant pain with bending the knee or trying to ambulate. Hx of bilateral meniscal repair [place]. No numbness, tingling or weakness. Took 800mg  ibuprofen prior to arrival which did help.  2. Persistent dry cough, nasal congestion x 2 weeks. No medications taken prior to arrival. Denies sore throat, chest pain, shortness of breath, fever, chills. A few friends with colds. No alleviating or aggravating factors.    History reviewed. No pertinent past medical history.  There are no active problems to display for this patient.   Past Surgical History:  Procedure Laterality Date  . KNEE SURGERY      OB History    Gravida Para Term Preterm AB Living   0 0 0 0 0 0   SAB TAB Ectopic Multiple Live Births   0 0 0 0 0       Home Medications    Prior to Admission medications   Medication Sig Start Date End Date Taking? Authorizing Provider  ibuprofen  (ADVIL,MOTRIN) 800 MG tablet Take 1 tablet (800 mg total) by mouth every 8 (eight) hours as needed. 11/14/16   Ward, Chase Picket, PA-C    Family History No family history on file.  Social History Social History  Substance Use Topics  . Smoking status: Never Smoker  . Smokeless tobacco: Never Used  . Alcohol use No     Allergies   Penicillins   Review of Systems Review of Systems  Constitutional: Negative for chills and fever.  HENT: Positive for congestion. Negative for ear discharge, ear pain, sinus pain, sinus pressure and sore throat.   Eyes: Negative for redness.  Respiratory: Positive for cough. Negative for shortness of breath and wheezing.   Cardiovascular: Negative for chest pain.  Gastrointestinal: Negative for abdominal pain, nausea and vomiting.  Musculoskeletal: Positive for arthralgias and joint swelling.  Neurological: Negative for weakness, numbness and headaches.     Physical Exam Updated Vital Signs BP 112/76 (BP Location: Right Arm)   Pulse 76   Temp 98.6 F (37 C) (Oral)   Resp 20   Ht 5\' 9"  (1.753 m)   Wt 113.4 kg (250 lb)   LMP 10/24/2016 (Approximate)   SpO2 100%   BMI 36.92 kg/m   Physical Exam  Constitutional: She appears well-developed and well-nourished. No distress.  HENT:  Head: Normocephalic and atraumatic.  Mouth/Throat: Oropharynx is clear and moist.  Neck: Neck supple.  Cardiovascular: Normal rate, regular rhythm and normal heart sounds.   No murmur heard. Pulmonary/Chest: Effort normal and breath sounds normal. No respiratory distress. She has no wheezes. She has no rales.  Musculoskeletal:  Tenderness to palpation along medial aspect of the knee and medial joint line. Decreased ROM 2/2 pain. Mild swelling appreciated. No abnormal alignment or patellar mobility. No bruising, erythema or warmth overlaying the joint. No varus/valgus laxity. 2+ DP pulses bilaterally. All compartments are soft. Sensation intact distal to injury.    Neurological: She is alert.  Skin: Skin is warm and dry.  Nursing note and vitals reviewed.    ED Treatments / Results  Labs (all labs ordered are listed, but only abnormal results are displayed) Labs Reviewed - No data to display  EKG  EKG Interpretation None       Radiology Dg Knee Complete 4 Views Left  Result Date: 11/14/2016 CLINICAL DATA:  Patient presents with medial knee pain after twisting injury last evening. EXAM: LEFT KNEE - COMPLETE 4+ VIEW COMPARISON:  None. FINDINGS: No evidence of acute fracture joint dislocation. No significant joint effusion. No evidence of arthropathy or other focal bone abnormality. Soft tissues are unremarkable. IMPRESSION: No acute fracture nor joint dislocation. No significant joint effusion. Electronically Signed   By: Tollie Ethavid  Kwon M.D.   On: 11/14/2016 15:06    Procedures Procedures (including critical care time)  Medications Ordered in ED Medications - No data to display   Initial Impression / Assessment and Plan / ED Course  I have reviewed the triage vital signs and the nursing notes.  Pertinent labs & imaging results that were available during my care of the patient were reviewed by me and considered in my medical decision making (see chart for details).    Tiffany [name] is a 21 y.o. female who presents to ED for two complaints:   1. Acute onset of left knee pain. Hx of bilateral meniscal repair out of [place] years ago. She does have tenderness along medial knee and medial joint line. X-ray negative. Placed in an immobilizer and strongly encouraged to follow up with ortho. Symptomatic home care instructions discussed.  2. Cough, congestion x 2 weeks c/w viral etiology. Lungs CTA. OP clear. Symptomatic home care instructions discussed. PCP follow up if no improvement.   Reasons to return to ER discussed and all questions answered.   Final Clinical Impressions(s) / ED Diagnoses   Final diagnoses:  Acute pain of  left knee    New Prescriptions Discharge Medication List as of 11/14/2016  3:17 PM       Ward, Chase PicketJaime Pilcher, PA-C 11/14/16 1559    Mesner, Barbara CowerJason, MD 11/16/16 1443

## 2016-11-14 NOTE — Discharge Instructions (Signed)
It was my pleasure taking care of you today!   Ibuprofen as needed for pain. Wear knee immobilizer when walking. Ice and elevate knee throughout the day.  Call the orthopedist listed today or tomorrow to schedule follow up appointment for recheck of ongoing knee pain in one week. That appointment can be canceled with a 24-48 hour notice if complete resolution of pain.  Return to the ER for new or worsening symptoms, any additional concerns.

## 2016-11-14 NOTE — ED Notes (Addendum)
Pt is alert and oriented x 4 and verbally responsive. Pt is c/o left knee pain, Pt has Hx or rt knee surgery, and states that she twisted her posture yesterday at the site of the knee and this morning woke up with knee pain. Pt denies pain at this time while at rest , however does report that pain is 10/10 with some movements. No apparent swelling or deformities are noted. + PMS x 4 ext

## 2016-11-14 NOTE — ED Triage Notes (Addendum)
Patient c/o left knee pain after "twisting it" last night. Reports pain worsens with movement. Hx meniscus repair.   Patient also c/o non productive cough x2 weeks. Denies chest pain and SOB.

## 2016-12-05 ENCOUNTER — Encounter (HOSPITAL_COMMUNITY): Payer: Self-pay

## 2016-12-05 ENCOUNTER — Emergency Department (HOSPITAL_COMMUNITY)
Admission: EM | Admit: 2016-12-05 | Discharge: 2016-12-05 | Disposition: A | Discharge status: Home / Self Care | Payer: No Typology Code available for payment source | Attending: Emergency Medicine | Admitting: Emergency Medicine

## 2016-12-05 DIAGNOSIS — Z79899 Other long term (current) drug therapy: Secondary | ICD-10-CM | POA: Diagnosis not present

## 2016-12-05 DIAGNOSIS — H6092 Unspecified otitis externa, left ear: Secondary | ICD-10-CM | POA: Diagnosis not present

## 2016-12-05 DIAGNOSIS — H9202 Otalgia, left ear: Secondary | ICD-10-CM | POA: Diagnosis present

## 2016-12-05 DIAGNOSIS — H60502 Unspecified acute noninfective otitis externa, left ear: Secondary | ICD-10-CM

## 2016-12-05 MED ORDER — KETOROLAC TROMETHAMINE 30 MG/ML IJ SOLN
30.0000 mg | Freq: Once | INTRAMUSCULAR | Status: AC
  Administered 2016-12-05: 30 mg via INTRAMUSCULAR
  Filled 2016-12-05: qty 1

## 2016-12-05 MED ORDER — CIPROFLOXACIN-DEXAMETHASONE 0.3-0.1 % OT SUSP
4.0000 [drp] | Freq: Once | OTIC | Status: AC
  Administered 2016-12-05: 4 [drp] via OTIC
  Filled 2016-12-05: qty 7.5

## 2016-12-05 NOTE — Discharge Instructions (Addendum)
Continue ibuprofen for pain.  Take Tylenol for additional pain relief.  Use Ciprodex drops twice a day for 7 days.  Follow-up with family doctor as needed

## 2016-12-05 NOTE — ED Provider Notes (Signed)
[facility]-EMERGENCY DEPT Provider Note   CSN: [id number] Arrival date & time: 12/05/16  [id number]     History   Chief Complaint Chief Complaint  Patient presents with  . Otalgia    HPI [name] is a 21 y.o. female.  HPI [name] is a 21 y.o. female presents to emergency department complaining of any ear pain.  Patient states she woke up at 2 AM with severe pain in left ear.  Denies any drainage.  She states sounds are muffled.  Denies any nasal congestion.  No sore throat.  Reports associated headache.  No fever or chills.  No cough.  Patient took ibuprofen 800 mg which did not help.  No history of the same.  Denies any trauma.  History reviewed. No pertinent past medical history.  There are no active problems to display for this patient.   Past Surgical History:  Procedure Laterality Date  . KNEE SURGERY      OB History    Gravida Para Term Preterm AB Living   0 0 0 0 0 0   SAB TAB Ectopic Multiple Live Births   0 0 0 0 0       Home Medications    Prior to Admission medications   Medication Sig Start Date End Date Taking? Authorizing Provider  ibuprofen (ADVIL,MOTRIN) 800 MG tablet Take 1 tablet (800 mg total) by mouth every 8 (eight) hours as needed. 11/14/16   Ward, Chase PicketJaime Pilcher, PA-C    Family History History reviewed. No pertinent family history.  Social History Social History   Tobacco Use  . Smoking status: Never Smoker  . Smokeless tobacco: Never Used  Substance Use Topics  . Alcohol use: No  . Drug use: No     Allergies   Penicillins   Review of Systems Review of Systems  Constitutional: Negative for chills and fever.  HENT: Positive for ear pain. Negative for congestion, sinus pain and sore throat.   Respiratory: Negative for cough, chest tightness and shortness of breath.   Cardiovascular: Negative for chest pain, palpitations and leg swelling.  Musculoskeletal: Negative for  arthralgias, myalgias, neck pain and neck stiffness.  Skin: Negative for rash.  Neurological: Positive for headaches. Negative for dizziness and weakness.  All other systems reviewed and are negative.    Physical Exam Updated Vital Signs BP (!) 151/96 (BP Location: Right Arm)   Pulse 98   Temp 97.9 F (36.6 C) (Oral)   Resp 18   LMP 11/21/2016   SpO2 99%   Physical Exam  Constitutional: She appears well-developed and well-nourished. No distress.  HENT:  Nose: Nose normal.  Mouth/Throat: Oropharynx is clear and moist.  Right external ear, ear canal, TM normal.  Left external ear normal, ear canal is erythematous, edematous, white exudate.  TM appears to be normal.  Eyes: Conjunctivae are normal.  Neck: Neck supple.  Cardiovascular: Normal rate, regular rhythm and normal heart sounds.  Pulmonary/Chest: Effort normal and breath sounds normal. No stridor. No respiratory distress. She has no wheezes.  Neurological: She is alert.  Skin: Skin is warm and dry.  Nursing note and vitals reviewed.    ED Treatments / Results  Labs (all labs ordered are listed, but only abnormal results are displayed) Labs Reviewed - No data to display  EKG  EKG Interpretation None       Radiology No results found.  Procedures Procedures (including critical care time)  Medications Ordered in ED Medications  ciprofloxacin-dexamethasone (CIPRODEX) 0.3-0.1 % OTIC (EAR) suspension 4 drop (not administered)  ketorolac (TORADOL) 30 MG/ML injection 30 mg (not administered)     Initial Impression / Assessment and Plan / ED Course  I have reviewed the triage vital signs and the nursing notes.  Pertinent labs & imaging results that were available during my care of the patient were reviewed by me and considered in my medical decision making (see chart for details).     Patient in emergency department with otitis externa in the left ear.  Will treat with Ciprodex drops.  Toradol IM ordered in  emergency department.  Home with Tylenol, Motrin, Ciprodex drops.  Follow-up as needed.  Vitals:   12/05/16 0347  BP: (!) 151/96  Pulse: 98  Resp: 18  Temp: 97.9 F (36.6 C)  TempSrc: Oral  SpO2: 99%     Final Clinical Impressions(s) / ED Diagnoses   Final diagnoses:  Acute otitis externa of left ear, unspecified type    ED Discharge Orders    None       Jaynie CrumbleKirichenko, Rylei Masella, PA-C 12/05/16 0618    Palumbo, April, MD 12/05/16 [phone]

## 2016-12-05 NOTE — ED Triage Notes (Signed)
Pt complains of acute left ear pain this am She says the other one sounds muffled

## 2017-01-16 HISTORY — PX: OTHER SURGICAL HISTORY: SHX169

## 2017-01-26 ENCOUNTER — Other Ambulatory Visit: Payer: Self-pay

## 2017-01-26 ENCOUNTER — Encounter (HOSPITAL_COMMUNITY): Payer: Self-pay | Admitting: *Deleted

## 2017-01-26 ENCOUNTER — Inpatient Hospital Stay (HOSPITAL_COMMUNITY)
Admission: AD | Admit: 2017-01-26 | Discharge: 2017-01-26 | Disposition: A | Discharge status: Home / Self Care | Payer: No Typology Code available for payment source | Source: Ambulatory Visit | Attending: Obstetrics and Gynecology | Admitting: Obstetrics and Gynecology

## 2017-01-26 DIAGNOSIS — Z791 Long term (current) use of non-steroidal anti-inflammatories (NSAID): Secondary | ICD-10-CM | POA: Diagnosis not present

## 2017-01-26 DIAGNOSIS — N898 Other specified noninflammatory disorders of vagina: Secondary | ICD-10-CM | POA: Diagnosis not present

## 2017-01-26 DIAGNOSIS — Z113 Encounter for screening for infections with a predominantly sexual mode of transmission: Secondary | ICD-10-CM | POA: Diagnosis not present

## 2017-01-26 DIAGNOSIS — Z9889 Other specified postprocedural states: Secondary | ICD-10-CM | POA: Insufficient documentation

## 2017-01-26 DIAGNOSIS — B9689 Other specified bacterial agents as the cause of diseases classified elsewhere: Secondary | ICD-10-CM

## 2017-01-26 DIAGNOSIS — N760 Acute vaginitis: Secondary | ICD-10-CM | POA: Diagnosis not present

## 2017-01-26 DIAGNOSIS — Z880 Allergy status to penicillin: Secondary | ICD-10-CM | POA: Diagnosis not present

## 2017-01-26 LAB — URINALYSIS, ROUTINE W REFLEX MICROSCOPIC
Bacteria, UA: NONE SEEN
Bilirubin Urine: NEGATIVE
GLUCOSE, UA: NEGATIVE mg/dL
Hgb urine dipstick: NEGATIVE
Ketones, ur: NEGATIVE mg/dL
Nitrite: NEGATIVE
PROTEIN: NEGATIVE mg/dL
Specific Gravity, Urine: 1.02 (ref 1.005–1.030)
pH: 5 (ref 5.0–8.0)

## 2017-01-26 LAB — WET PREP, GENITAL
SPERM: NONE SEEN
Trich, Wet Prep: NONE SEEN
YEAST WET PREP: NONE SEEN

## 2017-01-26 NOTE — MAU Note (Signed)
Pt reports she is here for  STD screening. States her ex-partner tested positive for Chlamydia and she wants to be checked, small amount of white discharge

## 2017-01-26 NOTE — MAU Provider Note (Signed)
History     CSN: [id number]  Arrival date and time: 01/26/17 1543   None     Chief Complaint  Patient presents with  . Vaginal Discharge    Pt. thinks is has an STI.    HPI  Tiffany [name] is 22 y.o. G0P0000 presenting for STD screening. She reports she ended a relationship 1 month ago and was contacted by her former partner that he had + chlamydia testing.  She is not sure when he contracted the infection.  She has not been sexually active X 1 month. She is asking for testing for BV.  Has hx of BV and thinks she may have it.   No past medical history on file.  Past Surgical History:  Procedure Laterality Date  . KNEE SURGERY      No family history on file.  Social History   Tobacco Use  . Smoking status: Never Smoker  . Smokeless tobacco: Never Used  Substance Use Topics  . Alcohol use: No  . Drug use: No    Allergies:  Allergies  Allergen Reactions  . Penicillins Other (See Comments)    Has patient had a PCN reaction causing immediate rash, facial/tongue/throat swelling, SOB or lightheadedness with hypotension:unsure Has patient had a PCN reaction causing severe rash involving mucus membranes or skin necrosis:unsure Has patient had a PCN reaction that required hospitalization:unsure Has patient had a PCN reaction occurring within the last 10 years:No Childhood Reaction If all of the above answers are "NO", then may proceed with Cephalosporin use.     Medications Prior to Admission  Medication Sig Dispense Refill Last Dose  . ibuprofen (ADVIL,MOTRIN) 800 MG tablet Take 1 tablet (800 mg total) by mouth every 8 (eight) hours as needed. 21 tablet 0     Review of Systems  Constitutional: Negative for activity change, appetite change and fever.  Respiratory: Negative for chest tightness.   Cardiovascular: Negative for chest pain.  Gastrointestinal: Negative for abdominal pain, nausea and vomiting.  Genitourinary: Positive for vaginal discharge.  Negative for dysuria, frequency and vaginal bleeding.  Psychiatric/Behavioral: The patient is not nervous/anxious.    Physical Exam   Blood pressure (!) 154/88, pulse 100, temperature 98.1 F (36.7 C), temperature source Oral, resp. rate 17, height 5\' 9"  (1.753 m), weight 261 lb (118.4 kg), last menstrual period 01/14/2017, SpO2 100 %.  Physical Exam  Constitutional: She is oriented to person, place, and time. She appears well-developed and well-nourished. No distress.  HENT:  Head: Normocephalic.  Neck: Normal range of motion.  Cardiovascular: Normal rate.  Respiratory: Effort normal.  GI: Soft. She exhibits no distension and no mass. There is no tenderness. There is no rebound and no guarding.  Genitourinary: There is no rash, tenderness or lesion on the right labia. There is no rash, tenderness or lesion on the left labia. Uterus is not enlarged and not tender. Cervix exhibits no motion tenderness, no discharge and no friability. Right adnexum displays no mass, no tenderness and no fullness. Left adnexum displays no tenderness and no fullness. No erythema, tenderness or bleeding in the vagina. Vaginal discharge (small amount of white frothy discharge) found.  Neurological: She is alert and oriented to person, place, and time.  Skin: Skin is warm and dry.  Psychiatric: She has a normal mood and affect. Her behavior is normal. Thought content normal.   Results for orders placed or performed during the hospital encounter of 01/26/17 (from the past 24 hour(s))  Urinalysis, Routine w reflex  microscopic     Status: Abnormal   Collection Time: 01/26/17  4:02 PM  Result Value Ref Range   Color, Urine YELLOW YELLOW   APPearance HAZY (A) CLEAR   Specific Gravity, Urine 1.020 1.005 - 1.030   pH 5.0 5.0 - 8.0   Glucose, UA NEGATIVE NEGATIVE mg/dL   Hgb urine dipstick NEGATIVE NEGATIVE   Bilirubin Urine NEGATIVE NEGATIVE   Ketones, ur NEGATIVE NEGATIVE mg/dL   Protein, ur NEGATIVE NEGATIVE  mg/dL   Nitrite NEGATIVE NEGATIVE   Leukocytes, UA MODERATE (A) NEGATIVE   RBC / HPF 0-5 0 - 5 RBC/hpf   WBC, UA 0-5 0 - 5 WBC/hpf   Bacteria, UA NONE SEEN NONE SEEN   Squamous Epithelial / LPF 0-5 (A) NONE SEEN   Mucus PRESENT   Wet prep, genital     Status: Abnormal   Collection Time: 01/26/17  5:00 PM  Result Value Ref Range   Yeast Wet Prep HPF POC NONE SEEN NONE SEEN   Trich, Wet Prep NONE SEEN NONE SEEN   Clue Cells Wet Prep HPF POC PRESENT (A) NONE SEEN   WBC, Wet Prep HPF POC MANY (A) NONE SEEN   Sperm NONE SEEN    MAU Course  Procedures  GC/CHL culture and HIV pending  MDM MSE Exam Labs  Assessment and Plan  A:  Possible exposure to Chlamydia      Abnormal Vaginal Discharge      Bacterial Vaginosis  P: Patient informed we will call with + GC/CHL result     Rx for Flagyl to pharmacy     Encouraged condom use if sexually active.   Dennison Mascot Key 01/26/2017, 4:55 PM

## 2017-01-26 NOTE — Discharge Instructions (Signed)

## 2017-01-26 NOTE — MAU Note (Addendum)
Pt. Here due to ex-boyfriend informing her, "I have and STD, you need to get checked".

## 2017-01-27 LAB — GC/CHLAMYDIA PROBE AMP (~~LOC~~) NOT AT ARMC
CHLAMYDIA, DNA PROBE: NEGATIVE
NEISSERIA GONORRHEA: NEGATIVE

## 2017-01-27 LAB — HIV ANTIBODY (ROUTINE TESTING W REFLEX): HIV Screen 4th Generation wRfx: NONREACTIVE

## 2017-01-29 ENCOUNTER — Other Ambulatory Visit: Payer: Self-pay | Admitting: Student

## 2017-01-29 ENCOUNTER — Telehealth (HOSPITAL_COMMUNITY): Payer: Self-pay

## 2017-01-29 DIAGNOSIS — B9689 Other specified bacterial agents as the cause of diseases classified elsewhere: Secondary | ICD-10-CM

## 2017-01-29 DIAGNOSIS — N760 Acute vaginitis: Secondary | ICD-10-CM

## 2017-01-29 MED ORDER — METRONIDAZOLE 500 MG PO TABS: 500.0000 mg | ORAL_TABLET | Freq: Two times a day (BID) | ORAL | 0 refills | Status: DC

## 2017-01-29 NOTE — Telephone Encounter (Signed)
Pt called stating she was here on Friday and the dr she had seen said she was going to send meds to her pharmacy. Pt has checked with her pharmacy all weekend and the meds are not there yet. Verified name and DOB and looked in her chart to see what the note said from her last encounter. Name and DOB given to provider to get her meds sent to her pharmacy.

## 2017-07-23 ENCOUNTER — Encounter (HOSPITAL_COMMUNITY): Payer: Self-pay

## 2017-07-23 ENCOUNTER — Emergency Department (HOSPITAL_COMMUNITY)
Admission: EM | Admit: 2017-07-23 | Discharge: 2017-07-23 | Disposition: A | Discharge status: Home / Self Care | Payer: No Typology Code available for payment source | Attending: Emergency Medicine | Admitting: Emergency Medicine

## 2017-07-23 ENCOUNTER — Other Ambulatory Visit: Payer: Self-pay

## 2017-07-23 DIAGNOSIS — Z5321 Procedure and treatment not carried out due to patient leaving prior to being seen by health care provider: Secondary | ICD-10-CM | POA: Insufficient documentation

## 2017-07-23 DIAGNOSIS — R51 Headache: Secondary | ICD-10-CM | POA: Diagnosis not present

## 2017-07-23 HISTORY — DX: Migraine, unspecified, not intractable, without status migrainosus: G43.909

## 2017-07-23 NOTE — ED Triage Notes (Signed)
Patient c/o migraine headache, N/V, nd sensitivity to light. x 1 week.

## 2017-07-25 ENCOUNTER — Encounter (HOSPITAL_COMMUNITY): Payer: Self-pay

## 2017-07-25 ENCOUNTER — Emergency Department (HOSPITAL_COMMUNITY)
Admission: EM | Admit: 2017-07-25 | Discharge: 2017-07-25 | Disposition: A | Discharge status: Home / Self Care | Payer: No Typology Code available for payment source | Attending: Emergency Medicine | Admitting: Emergency Medicine

## 2017-07-25 ENCOUNTER — Other Ambulatory Visit: Payer: Self-pay

## 2017-07-25 ENCOUNTER — Emergency Department (HOSPITAL_COMMUNITY): Payer: No Typology Code available for payment source

## 2017-07-25 DIAGNOSIS — R0789 Other chest pain: Secondary | ICD-10-CM | POA: Insufficient documentation

## 2017-07-25 DIAGNOSIS — I10 Essential (primary) hypertension: Secondary | ICD-10-CM

## 2017-07-25 DIAGNOSIS — G43009 Migraine without aura, not intractable, without status migrainosus: Secondary | ICD-10-CM

## 2017-07-25 DIAGNOSIS — Z79899 Other long term (current) drug therapy: Secondary | ICD-10-CM | POA: Diagnosis not present

## 2017-07-25 DIAGNOSIS — G43909 Migraine, unspecified, not intractable, without status migrainosus: Secondary | ICD-10-CM | POA: Insufficient documentation

## 2017-07-25 DIAGNOSIS — R079 Chest pain, unspecified: Secondary | ICD-10-CM | POA: Diagnosis present

## 2017-07-25 LAB — CBC
HCT: 35.9 % — ABNORMAL LOW (ref 36.0–46.0)
Hemoglobin: 12.1 g/dL (ref 12.0–15.0)
MCH: 30 pg (ref 26.0–34.0)
MCHC: 33.7 g/dL (ref 30.0–36.0)
MCV: 89.1 fL (ref 78.0–100.0)
PLATELETS: 431 10*3/uL — AB (ref 150–400)
RBC: 4.03 MIL/uL (ref 3.87–5.11)
RDW: 12.1 % (ref 11.5–15.5)
WBC: 13.1 10*3/uL — AB (ref 4.0–10.5)

## 2017-07-25 LAB — BASIC METABOLIC PANEL
Anion gap: 7 (ref 5–15)
BUN: 7 mg/dL (ref 6–20)
CHLORIDE: 108 mmol/L (ref 98–111)
CO2: 24 mmol/L (ref 22–32)
CREATININE: 0.58 mg/dL (ref 0.44–1.00)
Calcium: 8.9 mg/dL (ref 8.9–10.3)
Glucose, Bld: 97 mg/dL (ref 70–99)
Potassium: 4.1 mmol/L (ref 3.5–5.1)
SODIUM: 139 mmol/L (ref 135–145)

## 2017-07-25 LAB — I-STAT BETA HCG BLOOD, ED (MC, WL, AP ONLY)

## 2017-07-25 LAB — I-STAT TROPONIN, ED: Troponin i, poc: 0 ng/mL (ref 0.00–0.08)

## 2017-07-25 MED ORDER — DIPHENHYDRAMINE HCL 50 MG/ML IJ SOLN
25.0000 mg | Freq: Once | INTRAMUSCULAR | Status: AC
  Administered 2017-07-25: 25 mg via INTRAVENOUS
  Filled 2017-07-25: qty 1

## 2017-07-25 MED ORDER — METOCLOPRAMIDE HCL 5 MG/ML IJ SOLN
10.0000 mg | Freq: Once | INTRAMUSCULAR | Status: AC
  Administered 2017-07-25: 10 mg via INTRAVENOUS
  Filled 2017-07-25: qty 2

## 2017-07-25 MED ORDER — KETOROLAC TROMETHAMINE 30 MG/ML IJ SOLN
30.0000 mg | Freq: Once | INTRAMUSCULAR | Status: AC
  Administered 2017-07-25: 30 mg via INTRAVENOUS
  Filled 2017-07-25: qty 1

## 2017-07-25 MED ORDER — IBUPROFEN 800 MG PO TABS: 800.0000 mg | ORAL_TABLET | Freq: Three times a day (TID) | ORAL | 0 refills | Status: AC

## 2017-07-25 MED ORDER — SODIUM CHLORIDE 0.9 % IV BOLUS
1000.0000 mL | Freq: Once | INTRAVENOUS | Status: AC
  Administered 2017-07-25: 1000 mL via INTRAVENOUS

## 2017-07-25 NOTE — ED Provider Notes (Signed)
[facility]-EMERGENCY DEPT Provider Note  CSN: [id number] Arrival date & time: 07/25/17  1350  History   Chief Complaint Chief Complaint  Patient presents with  . Migraine  . Chest Pain    HPI Tiffany [name] is a 22 y.o. female with a medical history of migraine headache who presented to the ED for intermittent chest pain and headaches x 2 weeks.   Headaches began 2 weeks ago and patient states she has had the most days over this time. Associated symptoms: nausea, dizziness and photophobia.  She has a history of migraines, but is currently not on medications for it. She has tried nothing prior to coming to the ED. Denies paresthesias, weakness, facial droop, slurred speech, gait/coordination/balance issues or other neuro symptoms. Denies recent head trauma, falls or injuries. Patient does report that she has checked her BP when she is having a headache and that sometimes it is elevated.  Patient describes intermittent sharp midsternal chest pain that has been occurring the last couple of months. Episodes last < 1 minute. She is unable to identify any triggers or precipitating factors such as positions, time of day or correlation with eating. Denies SOB, palpitations, leg swelling, radiating pain, diaphoresis and  N/V.   Past Medical History:  Diagnosis Date  . Migraine     There are no active problems to display for this patient.   Past Surgical History:  Procedure Laterality Date  . KNEE SURGERY       OB History    Gravida  0   Para  0   Term  0   Preterm  0   AB  0   Living  0     SAB  0   TAB  0   Ectopic  0   Multiple  0   Live Births  0            Home Medications    Prior to Admission medications   Medication Sig Start Date End Date Taking? Authorizing Provider  JUNEL FE 1/20 1-20 MG-MCG tablet Take 1 tablet by mouth daily. 12/15/16  Yes [provider]  Lactobacillus (PROBIOTIC ACIDOPHILUS PO) Take 1  capsule by mouth daily.   Yes [provider]  ibuprofen (ADVIL,MOTRIN) 800 MG tablet Take 1 tablet (800 mg total) by mouth 3 (three) times daily for 10 days. 07/25/17 08/04/17  Mortis, Sharyon MedicusGabrielle I, PA-C    Family History Family History  Problem Relation Age of Onset  . Hypertension Mother   . Hypertension Father     Social History Social History   Tobacco Use  . Smoking status: Never Smoker  . Smokeless tobacco: Never Used  Substance Use Topics  . Alcohol use: No  . Drug use: No     Allergies   Penicillins   Review of Systems Review of Systems  Constitutional: Negative for activity change, appetite change, chills and fever.  HENT: Negative for congestion, ear pain, sinus pressure, sinus pain and sore throat.   Eyes: Positive for photophobia and visual disturbance. Negative for pain.  Respiratory: Negative for cough, choking, chest tightness and shortness of breath.   Cardiovascular: Positive for chest pain. Negative for palpitations and leg swelling.  Gastrointestinal: Positive for nausea. Negative for abdominal pain, constipation, diarrhea and vomiting.  Genitourinary: Negative.   Skin: Negative.   Neurological: Positive for dizziness, speech difficulty and headaches. Negative for syncope, weakness, light-headedness and numbness.  Psychiatric/Behavioral: Negative for confusion and decreased concentration.  Physical Exam Updated Vital Signs BP (!) 143/88 (BP Location: Left Arm)   Pulse 91   Temp 98.4 F (36.9 C) (Oral)   Resp 13   Ht 5\' 9"  (1.753 m)   Wt 117.9 kg (260 lb)   LMP 07/13/2017   SpO2 100%   BMI 38.40 kg/m   Physical Exam  Constitutional: She appears well-developed and well-nourished.  Obese.  HENT:  Right Ear: Tympanic membrane, external ear and ear canal normal.  Left Ear: Tympanic membrane, external ear and ear canal normal.  Mouth/Throat: Uvula is midline, oropharynx is clear and moist and mucous membranes are normal.  Eyes:  Pupils are equal, round, and reactive to light. Conjunctivae, EOM and lids are normal.  Neck: Normal range of motion and full passive range of motion without pain. Neck supple.  Cardiovascular: Normal rate, regular rhythm, normal heart sounds and intact distal pulses.  No murmur heard. Pulmonary/Chest: Effort normal and breath sounds normal.  Abdominal: Soft. Normal appearance and bowel sounds are normal. There is no tenderness.  Musculoskeletal: Normal range of motion.  Neurological: She is alert. She has normal strength and normal reflexes. No cranial nerve deficit or sensory deficit. She exhibits normal muscle tone. Gait normal.  Skin: Skin is warm and intact.  Psychiatric: Her speech is normal and behavior is normal. Thought content normal. Her mood appears anxious.  Nursing note and vitals reviewed.    ED Treatments / Results  Labs (all labs ordered are listed, but only abnormal results are displayed) Labs Reviewed  CBC - Abnormal; Notable for the following components:      Result Value   WBC 13.1 (*)    HCT 35.9 (*)    Platelets 431 (*)    All other components within normal limits  BASIC METABOLIC PANEL  I-STAT TROPONIN, ED  I-STAT BETA HCG BLOOD, ED ([facility], [facility], [facility] ONLY)    EKG None  Radiology Dg Chest 2 View  Result Date: 07/25/2017 CLINICAL DATA:  Patient c/o migraine, nausea, and sensitivity to light, and blurred vision. Patient stated that she has been having intermittent left chest pain x 2 days. patient denies any SOB or diaphoresis. EXAM: CHEST - 2 VIEW COMPARISON:  None. FINDINGS: Normal mediastinum and cardiac silhouette. Normal pulmonary vasculature. No evidence of effusion, infiltrate, or pneumothorax. No acute bony abnormality. IMPRESSION: Normal chest radiograph Electronically Signed   By: Genevive Bi M.D.   On: 07/25/2017 14:31    Procedures Procedures (including critical care time)  Medications Ordered in ED Medications  sodium chloride 0.9 % bolus  1,000 mL (0 mLs Intravenous Stopped 07/25/17 1820)  metoCLOPramide (REGLAN) injection 10 mg (10 mg Intravenous Given 07/25/17 1747)  ketorolac (TORADOL) 30 MG/ML injection 30 mg (30 mg Intravenous Given 07/25/17 1747)  diphenhydrAMINE (BENADRYL) injection 25 mg (25 mg Intravenous Given 07/25/17 1747)     Initial Impression / Assessment and Plan / ED Course  Triage vital signs and the nursing notes have been reviewed.  Pertinent labs & imaging results that were available during care of the patient were reviewed and considered in medical decision making (see chart for details).  Patient presents in no acute distress and is well appearing. Her BP is elevated on arrival, but remaining vitals are normal. She currently endorsed a 9/10 headache; although, her presentation did not reflect that. Physical exam is normal. No focal neuro deficits which assists in ruling out an acute neurologic process and the need for head imaging today. Patient has low risk factors for  an acute cardiac or pulmonary process that could be contributing to chest pain.   Clinical Course as of Jul 25 1828  Wed Jul 25, 2017  1539 EKG showed NSR. No ST elevations/depressions or signs of acute ischemia or infarct. This is reassuring in combination with negative troponin which assists in evaluating and ruling out an acute cardiac process. CXR also normal which is reassuring.   [GM]  1539 Mildly elevated WBC, but no other s/s or physical exam findings consistent with infection.   [GM]    Clinical Course User Index [GM] Mortis, Jerrel Ivory I, PA-C   Headache resolved with a migraine cocktail today. Physical exam and labs were all reassuring that headache was a migraine. Besides the elevated WBC which could be attributed to pain and stress, patient has no s/s with an infectious etiology. No meningeal signs, rash or AMS.  Final Clinical Impressions(s) / ED Diagnoses  1. Migraine. Cocktail of Toradol, Reglan and Benadryl administered in  the ED which provided relief. Ibuprofen Rx written. Advised patient to follow-up with a PCP if headaches become more frequent or severe to discuss the need for preventative medication. 2. Hypertension. Multiple health visits with BP > 140/90. Not established with PCP despite encouragement to do so. Given referral to [facility].    Dispo: Home. After thorough clinical evaluation, this patient is determined to be medically stable and can be safely discharged with the previously mentioned treatment and/or outpatient follow-up/referral(s). At this time, there are no other apparent medical conditions that require further screening, evaluation or treatment.   Final diagnoses:  Hypertension, unspecified type  Migraine without aura and without status migrainosus, not intractable  Atypical chest pain    ED Discharge Orders        Ordered    ibuprofen (ADVIL,MOTRIN) 800 MG tablet  3 times daily     07/25/17 1813        Mortis, Roanna Raider 07/25/17 1830    Jacalyn Lefevre, MD 07/25/17 2031

## 2017-07-25 NOTE — Discharge Instructions (Signed)
I have written you a prescription to help with your headaches. If these headaches continue to get more frequent, I recommend you follow-up with a PCP to discuss the need for preventative medications for them.  Your blood pressure was high today. Be sure to follow-up with a PCP to discuss the need for medication. I have [facility] the [facility] which you can go to for primary care or you may find someone on your own.

## 2017-07-25 NOTE — ED Triage Notes (Addendum)
Patient c/o migraine, nausea, and sensitivity to light, and blurred vision. Patient came to the ED 2 days ago and left due to wait times.  Patient stated that she has been having intermittent left chest pain x 2 days. patient denies any SOB or diaphoresis.

## 2017-08-06 ENCOUNTER — Encounter (HOSPITAL_COMMUNITY): Payer: Self-pay | Admitting: *Deleted

## 2017-08-06 ENCOUNTER — Emergency Department (HOSPITAL_COMMUNITY)
Admission: EM | Admit: 2017-08-06 | Discharge: 2017-08-06 | Disposition: A | Discharge status: Home / Self Care | Payer: No Typology Code available for payment source | Attending: Emergency Medicine | Admitting: Emergency Medicine

## 2017-08-06 ENCOUNTER — Emergency Department (HOSPITAL_COMMUNITY): Payer: No Typology Code available for payment source

## 2017-08-06 ENCOUNTER — Other Ambulatory Visit: Payer: Self-pay

## 2017-08-06 DIAGNOSIS — Z79899 Other long term (current) drug therapy: Secondary | ICD-10-CM | POA: Insufficient documentation

## 2017-08-06 DIAGNOSIS — R519 Headache, unspecified: Secondary | ICD-10-CM

## 2017-08-06 DIAGNOSIS — R51 Headache: Secondary | ICD-10-CM | POA: Diagnosis not present

## 2017-08-06 DIAGNOSIS — R030 Elevated blood-pressure reading, without diagnosis of hypertension: Secondary | ICD-10-CM | POA: Diagnosis not present

## 2017-08-06 LAB — I-STAT BETA HCG BLOOD, ED (MC, WL, AP ONLY)

## 2017-08-06 MED ORDER — DEXAMETHASONE SODIUM PHOSPHATE 10 MG/ML IJ SOLN
10.0000 mg | Freq: Once | INTRAMUSCULAR | Status: AC
  Administered 2017-08-06: 10 mg via INTRAVENOUS
  Filled 2017-08-06: qty 1

## 2017-08-06 MED ORDER — PROCHLORPERAZINE EDISYLATE 10 MG/2ML IJ SOLN
5.0000 mg | Freq: Once | INTRAMUSCULAR | Status: DC
  Filled 2017-08-06: qty 2

## 2017-08-06 MED ORDER — DIPHENHYDRAMINE HCL 50 MG/ML IJ SOLN
12.5000 mg | Freq: Once | INTRAMUSCULAR | Status: AC
  Administered 2017-08-06: 12.5 mg via INTRAVENOUS
  Filled 2017-08-06: qty 1

## 2017-08-06 MED ORDER — KETOROLAC TROMETHAMINE 30 MG/ML IJ SOLN
15.0000 mg | Freq: Once | INTRAMUSCULAR | Status: AC
  Administered 2017-08-06: 15 mg via INTRAVENOUS
  Filled 2017-08-06: qty 1

## 2017-08-06 NOTE — Discharge Instructions (Addendum)

## 2017-08-06 NOTE — ED Provider Notes (Signed)
[facility]   CSN: [id number] Arrival date & time: 08/06/17  1542     History   Chief Complaint Chief Complaint  Patient presents with  . wants a Ct Scan    HPI Tiffany [name] is a 22 y.o. female who presents the emergency depart for evaluation of headache.  Patient states that she has had headache for approximately 2 weeks.  She states that it is waxing and waning.  She has a history of migraine headaches but states that this feels different.  It is global, throbbing she has associated photophobia, phonophobia, blurry vision which is intermittent, occasional nausea.  She denies unilateral weakness, difficulty with speech or swallowing.  She denies fevers or rashes.  She has some neck stiffness but no nuchal rigidity.   HPI  Past Medical History:  Diagnosis Date  . Migraine     There are no active problems to display for this patient.   Past Surgical History:  Procedure Laterality Date  . DILATION AND CURETTAGE OF UTERUS    . KNEE SURGERY       OB History    Gravida  0   Para  0   Term  0   Preterm  0   AB  0   Living  0     SAB  0   TAB  0   Ectopic  0   Multiple  0   Live Births  0            Home Medications    Prior to Admission medications   Medication Sig Start Date End Date Taking? Authorizing Provider  azithromycin (ZITHROMAX) 250 MG tablet Take 250 mg by mouth daily. 08/04/17  Yes [provider]  JUNEL FE 1/20 1-20 MG-MCG tablet Take 1 tablet by mouth daily. 12/15/16  Yes [provider]    Family History Family History  Problem Relation Age of Onset  . Hypertension Mother   . Hypertension Father     Social History Social History   Tobacco Use  . Smoking status: Never Smoker  . Smokeless tobacco: Never Used  Substance Use Topics  . Alcohol use: No  . Drug use: No     Allergies   Penicillins   Review of Systems Review of Systems Ten  systems reviewed and are negative for acute change, except as noted in the HPI.    Physical Exam Updated Vital Signs BP (!) 156/102 (BP Location: Right Arm)   Pulse 92   Temp 98.1 F (36.7 C) (Oral)   Resp 18   Ht 5' 7.5" (1.715 m)   Wt 120.2 kg (265 lb)   LMP 07/13/2017   SpO2 98%   BMI 40.89 kg/m   Physical Exam  Constitutional: She is oriented to person, place, and time. She appears well-developed and well-nourished. No distress.  HENT:  Head: Normocephalic and atraumatic.  Mouth/Throat: Oropharynx is clear and moist.  Eyes: Pupils are equal, round, and reactive to light. Conjunctivae and EOM are normal. No scleral icterus.  No horizontal, vertical or rotational nystagmus  Neck: Normal range of motion. Neck supple.  Full active and passive ROM without pain No midline or paraspinal tenderness No nuchal rigidity or meningeal signs  Cardiovascular: Normal rate, regular rhythm and intact distal pulses.  Pulmonary/Chest: Effort normal and breath sounds normal. No respiratory distress. She has no wheezes. She has no rales.  Abdominal: Soft. Bowel sounds are normal. There is no tenderness. There  is no rebound and no guarding.  Musculoskeletal: Normal range of motion.  Lymphadenopathy:    She has no cervical adenopathy.  Neurological: She is alert and oriented to person, place, and time. No cranial nerve deficit. She exhibits normal muscle tone. Coordination normal.  Mental Status:  Alert, oriented, thought content appropriate. Speech fluent without evidence of aphasia. Able to follow 2 step commands without difficulty.  Cranial Nerves:  II:  Peripheral visual fields grossly normal, pupils equal, round, reactive to light III,IV, VI: ptosis not present, extra-ocular motions intact bilaterally  V,VII: smile symmetric, facial light touch sensation equal VIII: hearing grossly normal bilaterally  IX,X: midline uvula rise  XI: bilateral shoulder shrug equal and strong XII: midline  tongue extension  Motor:  5/5 in upper and lower extremities bilaterally including strong and equal grip strength and dorsiflexion/plantar flexion Sensory: Pinprick and light touch normal in all extremities.  Cerebellar: normal finger-to-nose with bilateral upper extremities Gait: normal gait and balance CV: distal pulses palpable throughout   Skin: Skin is warm and dry. No rash noted. She is not diaphoretic.  Psychiatric: She has a normal mood and affect. Her behavior is normal. Judgment and thought content normal.  Nursing note and vitals reviewed.    ED Treatments / Results  Labs (all labs ordered are listed, but only abnormal results are displayed) Labs Reviewed  I-STAT BETA HCG BLOOD, ED ([facility], [facility], [facility] ONLY)    EKG None  Radiology No results found.  Procedures Procedures (including critical care time)  Medications Ordered in ED Medications  dexamethasone (DECADRON) injection 10 mg (has no administration in time range)  ketorolac (TORADOL) 30 MG/ML injection 15 mg (has no administration in time range)  prochlorperazine (COMPAZINE) injection 5 mg (has no administration in time range)  diphenhydrAMINE (BENADRYL) injection 12.5 mg (has no administration in time range)     Initial Impression / Assessment and Plan / ED Course  I have reviewed the triage vital signs and the nursing notes.  Pertinent labs & imaging results that were available during my care of the patient were reviewed by me and considered in my medical decision making (see chart for details).   Patient with hypertension.  Advised to follow-up with her PCP.  She has been given outpatient follow-up with neurology for persistent headaches. Pt HA treated and improved while in ED.  Presentation is not concerning for [facility], ICH, Meningitis, or temporal arteritis. Pt is afebrile with no focal neuro deficits, nuchal rigidity, or change in vision. Pt is to follow up with PCP to discuss prophylactic medication. Pt  verbalizes understanding and is agreeable with plan to dc.    Final Clinical Impressions(s) / ED Diagnoses   Final diagnoses:  Bad headache  Elevated blood pressure reading    ED Discharge Orders    None       Arthor Captain, PA-C 08/07/17 Kerry Hough, MD 08/08/17 [phone]

## 2017-08-06 NOTE — ED Notes (Signed)
Patient transported to CT 

## 2017-08-06 NOTE — ED Triage Notes (Signed)
Pt has been seen for headache /neck pain several times. Symptoms for a couple of weeks, she feels she needs a CT

## 2017-08-22 ENCOUNTER — Encounter: Payer: Self-pay | Admitting: Neurology

## 2017-08-22 ENCOUNTER — Encounter: Payer: Self-pay | Admitting: *Deleted

## 2017-08-22 ENCOUNTER — Ambulatory Visit (INDEPENDENT_AMBULATORY_CARE_PROVIDER_SITE_OTHER): Payer: No Typology Code available for payment source | Admitting: Neurology

## 2017-08-22 VITALS — BP 133/91 | HR 112 | Ht 68.0 in | Wt 276.0 lb

## 2017-08-22 DIAGNOSIS — R51 Headache with orthostatic component, not elsewhere classified: Secondary | ICD-10-CM

## 2017-08-22 DIAGNOSIS — H532 Diplopia: Secondary | ICD-10-CM

## 2017-08-22 DIAGNOSIS — G8929 Other chronic pain: Secondary | ICD-10-CM

## 2017-08-22 DIAGNOSIS — H4710 Unspecified papilledema: Secondary | ICD-10-CM | POA: Diagnosis not present

## 2017-08-22 DIAGNOSIS — H05119 Granuloma of unspecified orbit: Secondary | ICD-10-CM

## 2017-08-22 DIAGNOSIS — R519 Headache, unspecified: Secondary | ICD-10-CM

## 2017-08-22 NOTE — Progress Notes (Signed)
[facility]    Provider:  Dr Lucia Gaskins Referring Provider: Arnette Felts, FNP Primary Care Physician:  Arnette Felts, FNP  CC:  headaches  HPI:  Tiffany [name] is a 22 y.o. female here as a referral from Dr. Christell Constant for headaches. She has a PMHx of migraines more in high school a few years ago.Headaches started a month ago, pressure behind the eyes, blurry vision, episodes of vision loss and darkness in vision, she has a fullness in her ears. Antibiotics did not help. She can wake up with headaches. Was getting them every day, sometimes she feels like she can't focus. Pressure and pulsating, she has light and sound sensitivity, nausea, movement makes it worse, a dark room helps. Headaches can last all day. Can be severe 7/10. She has gained 50 pounds. This is a different headache than her migraines. She had an IUD but removed. No long term use of antibiotics or acutane. She has diplopia, headaches are positional with hearing changes, pain on eye movement, orbital pain. Severe pain, continuous, morning on awakening. No other focal neurologic deficits, associated symptoms, inciting events or modifiable factors.  Reviewed notes, labs and imaging from outside physicians, which showed:  Reviewed referring physician notes, patient's been having headaches includes bilateral ocular symptoms not associated with menses or recent head trauma aggravating factors include bright lights and noise symptoms are not aggravated by allergies, anxiety, caffeine, exercise, head position and certain foods.  Feels better and darkness.  She is tried over-the-counter medications and analgesics.  She gets blurred vision, dizziness, nausea, personality changes and neck stiffness.  No diplopia no fever no vision loss no visual aura vertigo or vomiting.  She does feel as though this is not like her regular migraines and she is been to the emergency room.  She is on Maxalt for acute management.  She is been  to the emergency room multiple times and Maxalt has been ineffective and she has had Toradol and dexamethasone injections and continues to have a headache she was last treated with a steroid Dosepak.  Hemoglobin A1c 5, TSH 0.664, LFTs are normal, creatinine 0.6.  Review of Systems: Patient complains of symptoms per HPI as well as the following symptoms: heaache Pertinent negatives and positives per HPI. All others negative.   Social History   Socioeconomic History  . Marital status: Single    Spouse name: Not on file  . Number of children: Not on file  . Years of education: Not on file  . Highest education level: Bachelor's degree (e.g., BA, AB, BS)  Occupational History  . Not on file  Social Needs  . Financial resource strain: Not on file  . Food insecurity:    Worry: Not on file    Inability: Not on file  . Transportation needs:    Medical: Not on file    Non-medical: Not on file  Tobacco Use  . Smoking status: Never Smoker  . Smokeless tobacco: Never Used  Substance and Sexual Activity  . Alcohol use: No  . Drug use: No  . Sexual activity: Yes    Partners: Male    Comment: pt hasn't been taking her birth control medication  Lifestyle  . Physical activity:    Days per week: Not on file    Minutes per session: Not on file  . Stress: Not on file  Relationships  . Social connections:    Talks on phone: Not on file    Gets together: Not on file  Attends religious service: Not on file    Active member of club or organization: Not on file    Attends meetings of clubs or organizations: Not on file    Relationship status: Not on file  . Intimate partner violence:    Fear of current or ex partner: Not on file    Emotionally abused: Not on file    Physically abused: Not on file    Forced sexual activity: Not on file  Other Topics Concern  . Not on file  Social History Narrative   Lives at home alone   Right handed   Caffeine: about 1 cup weekly, most drinks juice  and water    Family History  Problem Relation Age of Onset  . Hypertension Mother   . Hypertension Father   . Breast cancer Maternal Grandfather     Past Medical History:  Diagnosis Date  . Migraine     Past Surgical History:  Procedure Laterality Date  . DILATION AND CURETTAGE OF UTERUS    . KNEE SURGERY Left     Current Outpatient Medications  Medication Sig Dispense Refill  . Phentermine HCl (ADIPEX-P PO) Take 1 tablet by mouth daily.    Colleen Can FE 1/20 1-20 MG-MCG tablet Take 1 tablet by mouth daily.  1   No current facility-administered medications for this visit.     Allergies as of 08/22/2017 - Review Complete 08/22/2017  Allergen Reaction Noted  . Penicillins Hives and Other (See Comments) 10/05/2014    Vitals: BP (!) 133/91 (BP Location: Right Arm, Patient Position: Sitting)   Pulse (!) 112   Ht 5\' 8"  (1.727 m)   Wt 276 lb (125.2 kg)   LMP 08/14/2017 (Approximate)   BMI 41.97 kg/m  Last Weight:  Wt Readings from Last 1 Encounters:  08/22/17 276 lb (125.2 kg)   Last Height:   Ht Readings from Last 1 Encounters:  08/22/17 5\' 8"  (1.727 m)    Physical exam: Exam: Gen: NAD, conversant, well nourised, obese, well groomed                     CV: RRR, no MRG. No Carotid Bruits. No peripheral edema, warm, nontender Eyes: Conjunctivae clear without exudates or hemorrhage  Neuro: Detailed Neurologic Exam  Speech:    Speech is normal; fluent and spontaneous with normal comprehension.  Cognition:    The patient is oriented to person, place, and time;     recent and remote memory intact;     language fluent;     normal attention, concentration,     fund of knowledge Cranial Nerves:    The pupils are equal, round, and reactive to light.Papilledema. Visual fields are full to finger confrontation. Extraocular movements are intact. Trigeminal sensation is intact and the muscles of mastication are normal. The face is symmetric. The palate elevates in the  midline. Hearing intact. Voice is normal. Shoulder shrug is normal. The tongue has normal motion without fasciculations.   Coordination:    Normal finger to nose and heel to shin. Normal rapid alternating movements.   Gait:    Heel-toe and tandem gait are normal.   Motor Observation:    No asymmetry, no atrophy, and no involuntary movements noted. Tone:    Normal muscle tone.    Posture:    Posture is normal. normal erect    Strength:    Strength is V/V in the upper and lower limbs.      Sensation: intact  to LT     Reflex Exam:  DTR's:    Deep tendon reflexes in the upper and lower extremities are normal bilaterally.   Toes:    The toes are downgoing bilaterally.   Clonus:    Clonus is absent.      Assessment/Plan:  22 year old morbidly obese patient with intractable chronic headaches. There is papilledema. Needs thorough workup. Also concomitant migraines likely.   - Ophthalmology for papilledema, may be IIH - MRI brain due to concerning symptoms of morning headaches, positional headaches,vision changes  to look for space occupying mass, chiari or intracranial hypertension (pseudotumor). MRI orbits due to eye pain, diplopia, orbital headaches to evaluate for other cause of papilledema within the globe such as orbital pseudotumor. - Lumbar puncture - Morbid Obesity: [facility]   Orders Placed This Encounter  Procedures  . MR BRAIN W WO CONTRAST  . MR ORBITS W WO CONTRAST  . DG FLUORO GUIDED LOC OF NEEDLE/CATH TIP FOR SPINAL INJECT LT  . [facility]  . Ambulatory referral to Ophthalmology   Discussed IIH, for acute vision changes or acute worsening go to ED  Discussed: To prevent or relieve headaches, try the following: Cool Compress. Lie down and place a cool compress on your head.  Avoid headache triggers. If certain foods or odors seem to have triggered your migraines in the past, avoid them. A headache diary  might help you identify triggers.  Include physical activity in your daily routine. Try a daily walk or other moderate aerobic exercise.  Manage stress. Find healthy ways to cope with the stressors, such as delegating tasks on your to-do list.  Practice relaxation techniques. Try deep breathing, yoga, massage and visualization.  Eat regularly. Eating regularly scheduled meals and maintaining a healthy diet might help prevent headaches. Also, drink plenty of fluids.  Follow a regular sleep schedule. Sleep deprivation might contribute to headaches Consider biofeedback. With this mind-body technique, you learn to control certain bodily functions - such as muscle tension, heart rate and blood pressure - to prevent headaches or reduce headache pain.    Proceed to emergency room if you experience new or worsening symptoms or symptoms do not resolve, if you have new neurologic symptoms or if headache is severe, or for any concerning symptom.   Provided education and documentation from American headache Society toolbox including articles on: chronic migraine medication overuse headache, chronic migraines, prevention of migraines, behavioral and other nonpharmacologic treatments for headache.  Naomie DeanAntonia Nohlan Burdin, MD  [facility] [address] [place], [place] [id number]  Phone [phone] Fax [phone]

## 2017-08-22 NOTE — Patient Instructions (Addendum)
MRI of the brain Lumbar puncture Ophthalmology referral If confirmed will start a headache medicine called Acetazolamide (which can also help you lose weight) [facility]    Idiopathic Intracranial Hypertension Idiopathic intracranial hypertension (IIH) is a condition that increases pressure around the brain. The fluid that surrounds the brain and spinal cord (cerebrospinal fluid, CSF) increases and causes the pressure. Idiopathic means that the cause of this condition is not known. IIH affects the brain and spinal cord (is a neurological disorder). If this condition is not treated, it can cause vision loss or blindness. What increases the risk? You are more likely to develop this condition if:  You are severely overweight (obese).  You are a woman who has not gone through menopause.  You take certain medicines, such as birth control or steroids.  What are the signs or symptoms? Symptoms of IIH include:  Headaches. This is the most common symptom.  Pain in the shoulders or neck.  Nausea and vomiting.  A "rushing water" or pulsing sound within the ears (pulsatile tinnitus).  Double vision.  Blurred vision.  Brief episodes of complete vision loss.  How is this diagnosed? This condition may be diagnosed based on:  Your symptoms.  Your medical history.  CT scan of the brain.  MRI of the brain.  Magnetic resonance venogram (MRV) to check veins in the brain.  Diagnostic lumbar puncture. This is a procedure to remove and examine a sample of cerebrospinal fluid. This procedure can determine whether too much fluid may be causing IIH.  A thorough eye exam to check for swelling or nerve damage in the eyes.  How is this treated? Treatment for this condition depends on your symptoms. The goal of treatment is to decrease the pressure around your brain. Common treatments include:  Medicines to decrease the production of spinal fluid and lower the  pressure within your skull.  Medicines to prevent or treat headaches.  Surgery to place drains (shunts) in your brain to remove excess fluid.  Lumbar puncture to remove excess cerebrospinal fluid.  Follow these instructions at home:  If you are overweight or obese, work with your health care provider to lose weight.  Take over-the-counter and prescription medicines only as told by your health care provider.  Do not drive or use heavy machinery while taking medicines that can make you sleepy.  Keep all follow-up visits as told by your health care provider. This is important. Contact a health care provider if:  You have changes in your vision, such as: ? Double vision. ? Not being able to see colors (color vision). Get help right away if:  You have any of the following symptoms and they get worse or do not get better. ? Headaches. ? Nausea. ? Vomiting. ? Vision changes or difficulty seeing. Summary  Idiopathic intracranial hypertension (IIH) is a condition that increases pressure around the brain. The cause is not known (is idiopathic).  The most common symptom of IIH is headaches.  Treatment may include medicines or surgery to relieve the pressure on your brain. This information is not intended to replace advice given to you by your health care provider. Make sure you discuss any questions you have with your health care provider. Document Released: 03/13/2001 Document Revised: 11/24/2015 Document Reviewed: 11/24/2015 Elsevier Interactive Patient Education  2017 Elsevier Inc.   Acetazolamide sustained-release capsules What is this medicine? ACETAZOLAMIDE (a set a ZOLE a mide) is used to treat glaucoma. It is also used to treat  and to prevent altitude or mountain sickness. This medicine may be used for other purposes; ask your health care provider or pharmacist if you have questions. COMMON BRAND NAME(S): Diamox, Diamox Sequels What should I tell my health care provider  before I take this medicine? They need to know if you have any of these conditions: -diabetes -kidney disease -liver disease -lung disease -an unusual or allergic reaction to acetazolamide, sulfa drugs, other medicines, foods, dyes, or preservatives -pregnant or trying to get pregnant -breast-feeding How should I use this medicine? Take this medicine by mouth with a glass of water. Follow the directions on the prescription label. Take with food or on an empty stomach. Do not crush or chew. Take your doses at regular intervals. Do not take your medicine more often than directed. Do not stop taking except on your doctor's advice. Talk to your pediatrician regarding the use of this medicine in children. Special care may be needed. Patients over 22 years old may have a stronger reaction and need a smaller dose. Overdosage: If you think you have taken too much of this medicine contact a poison control center or emergency room at once. NOTE: This medicine is only for you. Do not share this medicine with others. What if I miss a dose? If you miss a dose, take it as soon as you can. If it is almost time for your next dose, take only that dose. Do not take double or extra doses. What may interact with this medicine? Do not take this medicine with any of the following medications: -methazolamide This medicine may also interact with the following medications: -aspirin and aspirin-like medicines -cyclosporine -lithium -medicine for diabetes -methenamine -other diuretics -phenytoin -primidone -quinidine -sodium bicarbonate -stimulant medicines like dextroamphetamine This list may not describe all possible interactions. Give your health care provider a list of all the medicines, herbs, non-prescription drugs, or dietary supplements you use. Also tell them if you smoke, drink alcohol, or use illegal drugs. Some items may interact with your medicine. What should I watch for while using this  medicine? Visit your doctor or health care professional for regular checks on your progress. You will need blood work done regularly. If you are diabetic, check your blood sugar as directed. You may need to be on a special diet while taking this medicine. Ask your doctor. Also, ask how many glasses of fluid you need to drink a day. You must not get dehydrated. You may get drowsy or dizzy. Do not drive, use machinery, or do anything that needs mental alertness until you know how this medicine affects you. Do not stand or sit up quickly, especially if you are an older patient. This reduces the risk of dizzy or fainting spells. This medicine can make you more sensitive to the sun. Keep out of the sun. If you cannot avoid being in the sun, wear protective clothing and use sunscreen. Do not use sun lamps or tanning beds/booths. What side effects may I notice from receiving this medicine? Side effects that you should report to your doctor or health care professional as soon as possible: -allergic reactions like skin rash, itching or hives, swelling of the face, lips, or tongue -breathing problems -confusion, depression -dark urine -fever -numbness, tingling in hands or feet -redness, blistering, peeling or loosening of the skin, including inside the mouth -ringing in the ears -seizure -unusually weak or tired -yellowing of the eyes or skin Side effects that usually do not require medical attention (report to your  doctor or health care professional if they continue or are bothersome): -change in taste -diarrhea -headache -loss of appetite -nausea, vomiting -passing urine more often This list may not describe all possible side effects. Call your doctor for medical advice about side effects. You may report side effects to FDA at 1-800-FDA-1088. Where should I keep my medicine? Keep out of the reach of children. Store at room temperature between 20 and 25 degrees C (68 and 77 degrees F). Throw away  any unused medicine after the expiration date. NOTE: This sheet is a summary. It may not cover all possible information. If you have questions about this medicine, talk to your doctor, pharmacist, or health care provider.  2018 Elsevier/Gold Standard (2007-03-27 11:00:54)

## 2017-08-23 ENCOUNTER — Encounter: Payer: Self-pay | Admitting: Neurology

## 2017-08-23 DIAGNOSIS — G8929 Other chronic pain: Secondary | ICD-10-CM | POA: Insufficient documentation

## 2017-08-23 DIAGNOSIS — R51 Headache: Secondary | ICD-10-CM

## 2017-08-23 DIAGNOSIS — R519 Headache, unspecified: Secondary | ICD-10-CM | POA: Insufficient documentation

## 2017-08-24 ENCOUNTER — Telehealth: Payer: Self-pay | Admitting: Neurology

## 2017-08-24 NOTE — Telephone Encounter (Signed)
[employer] Marylee FlorasGEHA Auth: [id number]: [id number] (exp. 8/9/19t o 11/22/17) order sent to [facility]. They will reach out to the pt to schedule.

## 2017-08-27 NOTE — Telephone Encounter (Signed)
Patient is scheduled at [facility] for 09/02/17.

## 2017-09-02 ENCOUNTER — Ambulatory Visit
Admission: RE | Admit: 2017-09-02 | Discharge: 2017-09-02 | Disposition: A | Discharge status: Home / Self Care | Payer: No Typology Code available for payment source | Source: Ambulatory Visit | Attending: Neurology | Admitting: Neurology

## 2017-09-02 DIAGNOSIS — R519 Headache, unspecified: Secondary | ICD-10-CM

## 2017-09-02 DIAGNOSIS — G8929 Other chronic pain: Secondary | ICD-10-CM

## 2017-09-02 DIAGNOSIS — H4710 Unspecified papilledema: Secondary | ICD-10-CM

## 2017-09-02 DIAGNOSIS — R51 Headache with orthostatic component, not elsewhere classified: Secondary | ICD-10-CM

## 2017-09-02 DIAGNOSIS — H05119 Granuloma of unspecified orbit: Secondary | ICD-10-CM

## 2017-09-02 DIAGNOSIS — H532 Diplopia: Secondary | ICD-10-CM

## 2017-09-02 MED ORDER — GADOBENATE DIMEGLUMINE 529 MG/ML IV SOLN
20.0000 mL | Freq: Once | INTRAVENOUS | Status: AC | PRN
  Administered 2017-09-02: 20 mL via INTRAVENOUS

## 2017-09-05 ENCOUNTER — Telehealth: Payer: Self-pay | Admitting: *Deleted

## 2017-09-05 NOTE — Telephone Encounter (Addendum)
Attempted to call pt. Mailbox was full and phone did not ring.

## 2017-09-05 NOTE — Telephone Encounter (Signed)
-----   Message from Anson FretAntonia B Ahern, MD sent at 09/04/2017 12:06 PM EDT ----- MRI of the brain and orbits show possible increased fluid as we discussed. She needs the Lumbar Puncture, as of uet it has not been sheduled. Please ensure it gets scheduled thanks. 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 thanks

## 2017-09-05 NOTE — Telephone Encounter (Signed)
Attempted to call pt again. Mailbox was full and phone did not ring.

## 2017-09-06 NOTE — Telephone Encounter (Addendum)
Patient returned call. This RN gave her MRi brian, orbits result note per Dr Lucia GaskinsAhern. She stated she was called Tues about scheduling her LP. She was told they are only  done from  7 am -2:30 pm. On Monday she started training for a new job, hours are 8:30 am-5:30 pm x 8 weeks. She stated she cannot miss any training during the 8 weeks. 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 Eye dr told her it is causing her headaches, and could possibly be a pseudotumor cerebri, pending MRI results. Eye dr prescribed Diamox 500 mg, taking twice daily; she has started the medication.  She stated she will not be able to schedule the LP until  her 8 weeks of job training is over. This RN advised will let Dr Lucia GaskinsAhern know. She verbalized understanding, appreciation.

## 2017-09-06 NOTE — Telephone Encounter (Signed)
Pt returning RN's call, Tiffany PancoastMary Clare able to speak with pt

## 2017-09-06 NOTE — Telephone Encounter (Signed)
Attempted to reach patient to inform her that she will need to keep FU with Dr Lucia GaskinsAhern in Oct. Also need to advise her to come off the diamox 2 days before her LP is done. The patient didn't answer her phone, and voice mailbox is full.

## 2017-09-06 NOTE — Telephone Encounter (Signed)
At least she is on the right medication have her follow up with me in the office thanks

## 2017-09-07 ENCOUNTER — Telehealth: Payer: Self-pay | Admitting: Neurology

## 2017-09-07 NOTE — Telephone Encounter (Signed)
Pt states her MRI was done on 08-18 and she has not been contacted with results.  Pt is asking for a call once the MRI results are available

## 2017-09-07 NOTE — Telephone Encounter (Signed)
Called the patient and reviewed the MRI with her. Informed her that Dr Lucia GaskinsAhern wants a LP completed. The scheduling people have been reaching out to the patient to attempt to get her scheduled for LP. Patient states that they keep missing each other and she has been unable to get her scheduled. Informed her there is not much on my end that I can do because she would need to schedule that apt. Informed her that once she is scheduled to please stop the diamox 2 days prior to the LP. Pt verbalized understanding.

## 2017-10-12 ENCOUNTER — Ambulatory Visit
Admission: RE | Admit: 2017-10-12 | Discharge: 2017-10-12 | Disposition: A | Discharge status: Home / Self Care | Payer: No Typology Code available for payment source | Source: Ambulatory Visit | Attending: Neurology | Admitting: Neurology

## 2017-10-12 ENCOUNTER — Other Ambulatory Visit: Payer: Self-pay | Admitting: Neurology

## 2017-10-12 DIAGNOSIS — R519 Headache, unspecified: Secondary | ICD-10-CM

## 2017-10-12 DIAGNOSIS — R51 Headache with orthostatic component, not elsewhere classified: Secondary | ICD-10-CM

## 2017-10-12 DIAGNOSIS — G8929 Other chronic pain: Secondary | ICD-10-CM

## 2017-10-12 DIAGNOSIS — H05119 Granuloma of unspecified orbit: Secondary | ICD-10-CM

## 2017-10-12 DIAGNOSIS — H532 Diplopia: Secondary | ICD-10-CM

## 2017-10-12 DIAGNOSIS — H4710 Unspecified papilledema: Secondary | ICD-10-CM

## 2017-10-12 LAB — GLUCOSE, CSF: GLUCOSE CSF: 60 mg/dL (ref 40–80)

## 2017-10-12 LAB — CSF CELL COUNT WITH DIFFERENTIAL
RBC COUNT CSF: 0 {cells}/uL (ref 0–10)
WBC, CSF: 2 cells/uL (ref 0–5)

## 2017-10-12 LAB — PROTEIN, CSF: TOTAL PROTEIN, CSF: 20 mg/dL (ref 15–45)

## 2017-10-12 MED ORDER — ACETAZOLAMIDE ER 500 MG PO CP12: 500.0000 mg | ORAL_CAPSULE | Freq: Two times a day (BID) | ORAL | 11 refills | Status: DC

## 2017-10-12 NOTE — Discharge Instructions (Signed)

## 2017-10-15 ENCOUNTER — Telehealth: Payer: Self-pay | Admitting: Neurology

## 2017-10-15 NOTE — Telephone Encounter (Signed)
Left message per DPR. Significantly elevated opening pressure at 48. I said she should ensure she is taking Diamox twice daily and to call for any problems and we will see her in 6 weeks. She should be on the diamox every day, mentioned multiple times. t   IMPRESSION: 1. Technically successful lumbar puncture under fluoroscopy. 2. Elevated opening pressure 48 cm H2O, decreased to 15 after removing 18.5 mL.

## 2017-10-17 ENCOUNTER — Ambulatory Visit: Payer: No Typology Code available for payment source | Admitting: Neurology

## 2017-11-09 ENCOUNTER — Other Ambulatory Visit: Payer: No Typology Code available for payment source

## 2017-11-14 ENCOUNTER — Telehealth: Payer: Self-pay | Admitting: Neurology

## 2017-11-14 NOTE — Telephone Encounter (Signed)
Pt mother(on DPR-Mail[place]) is asking for a call to discuss headaches pt is having,  Pt mother feels they are because of antibiotics.  Please call to discuss

## 2017-11-15 NOTE — Telephone Encounter (Signed)
Patients mother called back. Please call and advise.

## 2017-11-15 NOTE — Telephone Encounter (Signed)
Returned pt's mother's call. LVM asking for call back if needed. Left office number and hours in message.

## 2017-11-16 NOTE — Telephone Encounter (Signed)
Rn call patients mom Mayre Bury on the dpr. The mom stated her daughter has been having headaches since taking flagyl and a shot for a bacteria infection, this past week Rn ask if we could speak to her daughter.Mom stated her daughter is at work and will not get off till 4pm. Rn ask if patient is taking the diamox per the instructions which is one pill twice a day. RN stated its very imperative she takes the medication so she will not have any flare up. Rn stated pt has appt next week with Dr. Lucia Gaskins for follow up of LP and MRI. RN stated pt was call in August 2019 by Rn with the results. VM was left for patient about LP results on 10/15/2017. Rn requested her daughter calls [place] back before 12pm because we are here a half a day today. The mom verbalized understanding, and will try to be at the appt on 11/22/2017.

## 2017-11-19 ENCOUNTER — Encounter (INDEPENDENT_AMBULATORY_CARE_PROVIDER_SITE_OTHER): Payer: Self-pay

## 2017-11-22 ENCOUNTER — Encounter: Payer: Self-pay | Admitting: Neurology

## 2017-11-22 ENCOUNTER — Ambulatory Visit: Payer: No Typology Code available for payment source | Admitting: Neurology

## 2017-11-22 VITALS — BP 137/90 | HR 108 | Ht 68.5 in | Wt 267.0 lb

## 2017-11-22 DIAGNOSIS — Z79899 Other long term (current) drug therapy: Secondary | ICD-10-CM

## 2017-11-22 DIAGNOSIS — G932 Benign intracranial hypertension: Secondary | ICD-10-CM | POA: Diagnosis not present

## 2017-11-22 NOTE — Progress Notes (Signed)
[facility]    Provider:  Dr Lucia Gaskins Primary Care Physician:  Arnette Felts, FNP  CC:  Headaches  Interval history 11/22/2017: Opening pressure was 48, significantly elevated. MRI of the brain showed signs consistent with Intracranial Hypertension. Discussed risks of vision loss. Also discussed weight loss as a key factor for improvement. She has also seen Dr. Dione Booze. She has been taking the acetazolamide. The pressure is better. She has only been taking one a day, need to increase to twice daily. She has follow up with Dr. Dione Booze tomorrow.   This MRI of the brain with and without contrast shows the following: 1.    The pituitary gland is flattened within an enlarged sella turcica consistent with a partially empty sella turcica.  This could be incidental or due to elevated intracranial pressure.2.    There were no acute findings and there is a normal enhancement pattern.   HPI:  Tiffany [name] is a 22 y.o. female here as a referral from Dr. No ref. provider found for headaches. She has a PMHx of migraines more in high school a few years ago.Headaches started a month ago, pressure behind the eyes, blurry vision, episodes of vision loss and darkness in vision, she has a fullness in her ears. Antibiotics did not help. She can wake up with headaches. Was getting them every day, sometimes she feels like she can't focus. Pressure and pulsating, she has light and sound sensitivity, nausea, movement makes it worse, a dark room helps. Headaches can last all day. Can be severe 7/10. She has gained 50 pounds. This is a different headache than her migraines. She had an IUD but removed. No long term use of antibiotics or acutane. She has diplopia, headaches are positional with hearing changes, pain on eye movement, orbital pain. Severe pain, continuous, morning on awakening. No other focal neurologic deficits, associated symptoms, inciting events or modifiable factors.  Reviewed notes,  labs and imaging from outside physicians, which showed:  Reviewed referring physician notes, patient's been having headaches includes bilateral ocular symptoms not associated with menses or recent head trauma aggravating factors include bright lights and noise symptoms are not aggravated by allergies, anxiety, caffeine, exercise, head position and certain foods.  Feels better and darkness.  She is tried over-the-counter medications and analgesics.  She gets blurred vision, dizziness, nausea, personality changes and neck stiffness.  No diplopia no fever no vision loss no visual aura vertigo or vomiting.  She does feel as though this is not like her regular migraines and she is been to the emergency room.  She is on Maxalt for acute management.  She is been to the emergency room multiple times and Maxalt has been ineffective and she has had Toradol and dexamethasone injections and continues to have a headache she was last treated with a steroid Dosepak.  Hemoglobin A1c 5, TSH 0.664, LFTs are normal, creatinine 0.6.  Review of Systems: Patient complains of symptoms per HPI as well as the following symptoms: heaache Pertinent negatives and positives per HPI. All others negative.   Social History   Socioeconomic History  . Marital status: Single    Spouse name: Not on file  . Number of children: Not on file  . Years of education: Not on file  . Highest education level: Bachelor's degree (e.g., BA, AB, BS)  Occupational History  . Not on file  Social Needs  . Financial resource strain: Not on file  . Food insecurity:    Worry: Not on  file    Inability: Not on file  . Transportation needs:    Medical: Not on file    Non-medical: Not on file  Tobacco Use  . Smoking status: Never Smoker  . Smokeless tobacco: Never Used  Substance and Sexual Activity  . Alcohol use: No  . Drug use: No  . Sexual activity: Yes    Partners: Male    Comment: pt hasn't been taking her birth control medication    Lifestyle  . Physical activity:    Days per week: Not on file    Minutes per session: Not on file  . Stress: Not on file  Relationships  . Social connections:    Talks on phone: Not on file    Gets together: Not on file    Attends religious service: Not on file    Active member of club or organization: Not on file    Attends meetings of clubs or organizations: Not on file    Relationship status: Not on file  . Intimate partner violence:    Fear of current or ex partner: Not on file    Emotionally abused: Not on file    Physically abused: Not on file    Forced sexual activity: Not on file  Other Topics Concern  . Not on file  Social History Narrative   Lives at home alone   Right handed   Caffeine: about 1 cup weekly, most drinks juice and water    Family History  Problem Relation Age of Onset  . Hypertension Mother   . Hypertension Father   . Breast cancer Maternal Grandfather     Past Medical History:  Diagnosis Date  . Migraine     Past Surgical History:  Procedure Laterality Date  . DILATION AND CURETTAGE OF UTERUS    . KNEE SURGERY Left   . lumbar puncture  2019    Current Outpatient Medications  Medication Sig Dispense Refill  . acetaZOLAMIDE (DIAMOX) 500 MG capsule Take 1 capsule (500 mg total) by mouth 2 (two) times daily. 60 capsule 11  . JUNEL FE 1/20 1-20 MG-MCG tablet Take 1 tablet by mouth daily.  1  . Phentermine HCl (ADIPEX-P PO) Take 1 tablet by mouth daily.     No current facility-administered medications for this visit.     Allergies as of 11/22/2017 - Review Complete 11/22/2017  Allergen Reaction Noted  . Penicillins Hives and Other (See Comments) 10/05/2014    Vitals: BP 137/90 (BP Location: Right Arm, Patient Position: Sitting)   Pulse (!) 108   Ht 5' 8.5" (1.74 m)   Wt 267 lb (121.1 kg)   LMP 11/16/2017 (Approximate)   BMI 40.01 kg/m  Last Weight:  Wt Readings from Last 1 Encounters:  11/22/17 267 lb (121.1 kg)   Last  Height:   Ht Readings from Last 1 Encounters:  11/22/17 5' 8.5" (1.74 m)    Physical exam: Exam: Gen: NAD, conversant, well nourised, obese, well groomed                     CV: RRR, no MRG. No Carotid Bruits. No peripheral edema, warm, nontender Eyes: Conjunctivae clear without exudates or hemorrhage  Neuro: Detailed Neurologic Exam  Speech:    Speech is normal; fluent and spontaneous with normal comprehension.  Cognition:    The patient is oriented to person, place, and time;     recent and remote memory intact;     language fluent;  normal attention, concentration,     fund of knowledge Cranial Nerves:    The pupils are equal, round, and reactive to light.Papilledema improved. Visual fields are full to finger confrontation. Extraocular movements are intact. Trigeminal sensation is intact and the muscles of mastication are normal. The face is symmetric. The palate elevates in the midline. Hearing intact. Voice is normal. Shoulder shrug is normal. The tongue has normal motion without fasciculations.   Coordination:    Normal finger to nose and heel to shin. Normal rapid alternating movements.   Gait:    Heel-toe and tandem gait are normal.   Motor Observation:    No asymmetry, no atrophy, and no involuntary movements noted. Tone:    Normal muscle tone.    Posture:    Posture is normal. normal erect    Strength:    Strength is V/V in the upper and lower limbs.      Sensation: intact to LT     Reflex Exam:  DTR's:    Deep tendon reflexes in the upper and lower extremities are normal bilaterally.   Toes:    The toes are downgoing bilaterally.   Clonus:    Clonus is absent.      Assessment/Plan:  22 year old morbidly obese patient with intractable chronic headaches. IIH diagnosed.   - Opening pressure was 48, significantly elevated.  - MRI of the brain showed signs consistent with Intracranial Hypertension. - Discussed risks of vision loss. Also discussed  weight loss as a key factor for improvement.  - She has also seen Dr. Dione Booze. She has been taking the acetazolamide. The symptoms are better. She has only been taking one tab a day, need to increase to twice daily.  - She has follow up with Dr. Dione Booze tomorrow.  -  Orders Placed This Encounter  Procedures  . Basic Metabolic Panel  . Acetazolamide, (Diamox), S/P  . Amb ref to Medical Nutrition Therapy-MNT     This MRI of the brain with and without contrast shows the following: 1.    The pituitary gland is flattened within an enlarged sella turcica consistent with a partially empty sella turcica.  This could be incidental or due to elevated intracranial pressure.2. There were no acute findings and there is a normal enhancement pattern.  - Morbid Obesity: [facility], she has been contacted  Discussed IIH, for acute vision changes or acute worsening go to ED. Needs weight loss. Also discussed with mother on the phone.   Discussed: To prevent or relieve headaches, try the following: Cool Compress. Lie down and place a cool compress on your head.  Avoid headache triggers. If certain foods or odors seem to have triggered your migraines in the past, avoid them. A headache diary might help you identify triggers.  Include physical activity in your daily routine. Try a daily walk or other moderate aerobic exercise.  Manage stress. Find healthy ways to cope with the stressors, such as delegating tasks on your to-do list.  Practice relaxation techniques. Try deep breathing, yoga, massage and visualization.  Eat regularly. Eating regularly scheduled meals and maintaining a healthy diet might help prevent headaches. Also, drink plenty of fluids.  Follow a regular sleep schedule. Sleep deprivation might contribute to headaches Consider biofeedback. With this mind-body technique, you learn to control certain bodily functions - such as muscle tension, heart rate and blood pressure - to  prevent headaches or reduce headache pain.    Proceed to emergency room if  you experience new or worsening symptoms or symptoms do not resolve, if you have new neurologic symptoms or if headache is severe, or for any concerning symptom.   Provided education and documentation from American headache Society toolbox including articles on: chronic migraine medication overuse headache, chronic migraines, prevention of migraines, behavioral and other nonpharmacologic treatments for headache.  Naomie Dean, MD  [facility] [address] [place], [place] [address]  Phone [phone] Fax [phone]

## 2017-11-22 NOTE — Patient Instructions (Signed)
Idiopathic Intracranial Hypertension Idiopathic intracranial hypertension (IIH) is a condition that increases pressure around the brain. The fluid that surrounds the brain and spinal cord (cerebrospinal fluid, CSF) increases and causes the pressure. Idiopathic means that the cause of this condition is not known. IIH affects the brain and spinal cord (is a neurological disorder). If this condition is not treated, it can cause vision loss or blindness. What increases the risk? You are more likely to develop this condition if:  You are severely overweight (obese).  You are a woman who has not gone through menopause.  You take certain medicines, such as birth control or steroids.  What are the signs or symptoms? Symptoms of IIH include:  Headaches. This is the most common symptom.  Pain in the shoulders or neck.  Nausea and vomiting.  A "rushing water" or pulsing sound within the ears (pulsatile tinnitus).  Double vision.  Blurred vision.  Brief episodes of complete vision loss.  How is this diagnosed? This condition may be diagnosed based on:  Your symptoms.  Your medical history.  CT scan of the brain.  MRI of the brain.  Magnetic resonance venogram (MRV) to check veins in the brain.  Diagnostic lumbar puncture. This is a procedure to remove and examine a sample of cerebrospinal fluid. This procedure can determine whether too much fluid may be causing IIH.  A thorough eye exam to check for swelling or nerve damage in the eyes.  How is this treated? Treatment for this condition depends on your symptoms. The goal of treatment is to decrease the pressure around your brain. Common treatments include:  Medicines to decrease the production of spinal fluid and lower the pressure within your skull.  Medicines to prevent or treat headaches.  Surgery to place drains (shunts) in your brain to remove excess fluid.  Lumbar puncture to remove excess cerebrospinal  fluid.  Follow these instructions at home:  If you are overweight or obese, work with your health care provider to lose weight.  Take over-the-counter and prescription medicines only as told by your health care provider.  Do not drive or use heavy machinery while taking medicines that can make you sleepy.  Keep all follow-up visits as told by your health care provider. This is important. Contact a health care provider if:  You have changes in your vision, such as: ? Double vision. ? Not being able to see colors (color vision). Get help right away if:  You have any of the following symptoms and they get worse or do not get better. ? Headaches. ? Nausea. ? Vomiting. ? Vision changes or difficulty seeing. Summary  Idiopathic intracranial hypertension (IIH) is a condition that increases pressure around the brain. The cause is not known (is idiopathic).  The most common symptom of IIH is headaches.  Treatment may include medicines or surgery to relieve the pressure on your brain. This information is not intended to replace advice given to you by your health care provider. Make sure you discuss any questions you have with your health care provider. Document Released: 03/13/2001 Document Revised: 11/24/2015 Document Reviewed: 11/24/2015 Elsevier Interactive Patient Education  2017 [employer] Inc[employer]  

## 2017-12-19 ENCOUNTER — Ambulatory Visit: Payer: No Typology Code available for payment source | Admitting: Registered"

## 2017-12-27 ENCOUNTER — Encounter: Payer: No Typology Code available for payment source | Attending: Neurology | Admitting: Registered"

## 2017-12-27 ENCOUNTER — Encounter: Payer: Self-pay | Admitting: Registered"

## 2017-12-27 DIAGNOSIS — G932 Benign intracranial hypertension: Secondary | ICD-10-CM | POA: Diagnosis present

## 2017-12-27 DIAGNOSIS — Z713 Dietary counseling and surveillance: Secondary | ICD-10-CM | POA: Diagnosis not present

## 2017-12-27 NOTE — Patient Instructions (Addendum)
-   Aim to have snacks between lunch and dinner.   - Combine protein with carbohydrates for snack options such as fruit + peanut butter or greek yogurt or peanut butter crackers.   - Aim to have vegetables with lunch and dinner.   - Add protein option to breakfast.   - Increase physical activity by attending work gym 2-3 days/week for at least 15 minutes each.

## 2017-12-27 NOTE — Progress Notes (Signed)
Medical Nutrition Therapy:  Appt start time: 3:45 end time:  4:42.   Assessment:  Primary concerns today: Pt states she was experiencing headaches, had MRI done, and states it could be a lot of things causing headaches. Pt states she was referred for weight loss to help with headaches. Pt states headaches have improved already with medication and lumbar shot. Pt states she started gaining weight from dep shot.   Pt expectations: none stated  Pt states she wants to lose weight. Pt states she knows there are a lot of things she can change that she is currently not doing. Pt states she wants to lose weight for the purpose of being comfortable in certain clothing and being less winded when walking from parking lot into work. Pt states she compares herself to how she used to look. Pt states she likes the gym but does not like going alone. Pt states she has gym at home and work; would feel safer exercsing at work gym.   Pt states she is picky about a lot of things: food, clothing, and changes her mind a lot about what she wants to eat and what she has a taste for. Pt states she is not a snack person. Pt states sometimes she does not have a taste for anything and went 2 weeks only eating once a day. Pt states she felt herself feeling weak and made herself eat something for dinner. Pt states she does not like going to grocery store and does not like waiting in lines to purchase food.   Pt states she avoids starches (bread, pasta, rice, etc) because it makes her gain weight although she loves spaghetti.   Pt states she works 7-4 pm. 5 days/week.    Preferred Learning Style:   No preference indicated   Learning Readiness:   Ready  Change in progress   MEDICATIONS: See list   DIETARY INTAKE:  Usual eating pattern includes 1-3 meals and 0-1 snacks per day.  Everyday foods include pasta.  Avoided foods include pork, beef (sometimes), pasta, rice.    24-hr recall:  B (9:15-9:30 AM): sometimes  skips; bagel + tator tots (something really small)  Snk ( AM): sometimes cheez-its  L (11-12 PM): chicken alfredo  Snk ( PM): none D (7-8 PM): sometimes skips; chicken alfredo or chicken breast + broccoli + corn or shrimp + zucchini Snk ( PM): none Beverages: water, juice  Usual physical activity: none stated  Estimated energy needs: 2000 calories 180 g carbohydrates 150 g protein 56 g fat  Progress Towards Goal(s):  In progress.   Nutritional Diagnosis:  NB-1.1 Food and nutrition-related knowledge deficit As related to unsupported attitudes about food, nutrition, and nutirtion-related information.  As evidenced by pt reports avoiding starches.    Intervention:  Nutrition education and counseling. Pt was educated and counseled on the benefits of carbohydrates/starches, having well-balanced meals, eating to nourish our bodies, metabolism and creating consistency with meals, meal planning, ways to grocery shop, and how to incorporate physical activity. Pt was in agreement with goals listed.  Goals: - Aim to have snacks between lunch and dinner.  - Combine protein with carbohydrates for snack options such as fruit + peanut butter or greek yogurt or peanut butter crackers.  - Aim to have vegetables with lunch and dinner.  - Add protein option to breakfast.  - Increase physical activity by attending work gym 2-3 days/week for at least 15 minutes each.   Teaching Method Utilized:  Visual Auditory Hands  on  Handouts given during visit include:  My Plate  Barriers to learning/adherence to lifestyle change: preconceived ideas about weight loss  Demonstrated degree of understanding via:  Teach Back   Monitoring/Evaluation:  Dietary intake, exercise, and body weight prn.

## 2018-01-12 ENCOUNTER — Other Ambulatory Visit: Payer: Self-pay

## 2018-01-12 ENCOUNTER — Emergency Department (HOSPITAL_COMMUNITY): Payer: No Typology Code available for payment source

## 2018-01-12 ENCOUNTER — Encounter (HOSPITAL_COMMUNITY): Payer: Self-pay | Admitting: Emergency Medicine

## 2018-01-12 ENCOUNTER — Emergency Department (HOSPITAL_COMMUNITY)
Admission: EM | Admit: 2018-01-12 | Discharge: 2018-01-12 | Disposition: A | Discharge status: Home / Self Care | Payer: No Typology Code available for payment source | Attending: Emergency Medicine | Admitting: Emergency Medicine

## 2018-01-12 DIAGNOSIS — B9789 Other viral agents as the cause of diseases classified elsewhere: Secondary | ICD-10-CM | POA: Diagnosis not present

## 2018-01-12 DIAGNOSIS — J069 Acute upper respiratory infection, unspecified: Secondary | ICD-10-CM | POA: Diagnosis not present

## 2018-01-12 DIAGNOSIS — R05 Cough: Secondary | ICD-10-CM | POA: Diagnosis not present

## 2018-01-12 DIAGNOSIS — R52 Pain, unspecified: Secondary | ICD-10-CM | POA: Diagnosis present

## 2018-01-12 LAB — GROUP A STREP BY PCR: GROUP A STREP BY PCR: NOT DETECTED

## 2018-01-12 LAB — POC URINE PREG, ED: Preg Test, Ur: NEGATIVE

## 2018-01-12 LAB — INFLUENZA PANEL BY PCR (TYPE A & B)
INFLAPCR: NEGATIVE
Influenza B By PCR: NEGATIVE

## 2018-01-12 MED ORDER — BENZONATATE 100 MG PO CAPS: 100.0000 mg | ORAL_CAPSULE | Freq: Three times a day (TID) | ORAL | 0 refills | Status: DC

## 2018-01-12 MED ORDER — ACETAMINOPHEN 325 MG PO TABS
650.0000 mg | ORAL_TABLET | Freq: Once | ORAL | Status: AC
Start: 2018-01-12 — End: 2018-01-12
  Administered 2018-01-12: 650 mg via ORAL
  Filled 2018-01-12: qty 2

## 2018-01-12 MED ORDER — LIDOCAINE VISCOUS HCL 2 % MT SOLN: 15.0000 mL | OROMUCOSAL | 0 refills | Status: DC | PRN

## 2018-01-12 NOTE — ED Provider Notes (Signed)
[facility]-EMERGENCY DEPT Provider Note   CSN: [id number] Arrival date & time: 01/12/18  0710     History   Chief Complaint Chief Complaint  Patient presents with  . Generalized Body Aches  . Emesis    HPI Tiffany [name] is a 22 y.o. female presents today for 3-day history of subjective fever, chills, generalized body aches and one episode of emesis this morning.  Patient states that she has felt like she has had a cold that has gradually worsened.  Patient states that last week she had runny nose, sore throat and cough which had previously improved until 3 days ago when it again worsened.  Patient denies measuring fever at home.  Patient states that her one episode of vomiting this morning was nonbloody, nonbilious.  She denies abdominal pain or diarrhea.  Patient states that her cough is mild, nonproductive and intermittent.  She denies shortness of breath or chest pain.  She denies leg swelling.  Patient describes her body aches as a generalized aching sensation mild and worsened with activity.  She has been using NyQuil with minimal relief, last dose last night.  She denies focal area of pain.  HPI  Past Medical History:  Diagnosis Date  . Migraine     Patient Active Problem List   Diagnosis Date Noted  . IIH (idiopathic intracranial hypertension) 11/22/2017  . Chronic intractable headache 08/23/2017    Past Surgical History:  Procedure Laterality Date  . DILATION AND CURETTAGE OF UTERUS    . KNEE SURGERY Left   . lumbar puncture  2019     OB History    Gravida  0   Para  0   Term  0   Preterm  0   AB  0   Living  0     SAB  0   TAB  0   Ectopic  0   Multiple  0   Live Births  0            Home Medications    Prior to Admission medications   Medication Sig Start Date End Date Taking? Authorizing Provider  acetaZOLAMIDE (DIAMOX) 500 MG capsule Take 1 capsule (500 mg total) by mouth 2 (two) times daily.  10/12/17  Yes Anson FretAhern, Antonia B, MD  Pseudoephedrine-APAP-DM (DAYQUIL PO) Take 1 tablet by mouth daily as needed (congestion).   Yes [provider]  benzonatate (TESSALON) 100 MG capsule Take 1 capsule (100 mg total) by mouth every 8 (eight) hours. 01/12/18   Harlene SaltsMorelli, Myleah Cavendish A, PA-C  lidocaine (XYLOCAINE) 2 % solution Use as directed 15 mLs in the mouth or throat as needed for mouth pain (Do NOT swallow). 01/12/18   Bill SalinasMorelli, Sandhya Denherder A, PA-C    Family History Family History  Problem Relation Age of Onset  . Hypertension Mother   . Hypertension Father   . Breast cancer Maternal Grandfather     Social History Social History   Tobacco Use  . Smoking status: Never Smoker  . Smokeless tobacco: Never Used  Substance Use Topics  . Alcohol use: No  . Drug use: No     Allergies   Penicillins   Review of Systems Review of Systems  Constitutional: Positive for chills. Negative for fever.  HENT: Positive for congestion, rhinorrhea and sore throat. Negative for drooling, facial swelling, trouble swallowing and voice change.   Eyes: Negative.  Negative for visual disturbance.  Respiratory: Positive for cough. Negative for shortness of breath.  Cardiovascular: Negative.  Negative for chest pain and leg swelling.  Gastrointestinal: Positive for vomiting (Single episode, nonbloody/nonbilious). Negative for abdominal pain, diarrhea and nausea.  Genitourinary: Negative.  Negative for dysuria and hematuria.  Musculoskeletal: Positive for arthralgias and myalgias. Negative for neck pain and neck stiffness.  Skin: Negative.  Negative for rash.  Neurological: Negative.  Negative for dizziness, weakness and headaches.   Physical Exam Updated Vital Signs BP (!) 142/95 (BP Location: Left Arm)   Pulse 89   Temp 98.9 F (37.2 C) (Oral)   Resp 15   LMP 12/29/2017 (Approximate)   SpO2 100%   Physical Exam Constitutional:      General: She is not in acute distress.    Appearance:  She is well-developed. She is not ill-appearing or diaphoretic.  HENT:     Head: Normocephalic and atraumatic.     Right Ear: Hearing, tympanic membrane, ear canal and external ear normal.     Left Ear: Hearing, tympanic membrane, ear canal and external ear normal.     Nose: Congestion and rhinorrhea present. Rhinorrhea is clear.     Mouth/Throat:     Lips: Pink.     Mouth: Mucous membranes are moist.     Pharynx: Oropharynx is clear. Uvula midline.     Comments: The patient has normal phonation and is in control of secretions. No stridor.  Midline uvula without edema. Soft palate rises symmetrically. No tonsillar erythema, swelling or exudates. Tongue protrusion is normal, floor of mouth is soft. No trismus. No creptius on neck palpation. No gingival erythema or fluctuance noted. Mucus membranes moist. Eyes:     General: Vision grossly intact. Gaze aligned appropriately.     Extraocular Movements: Extraocular movements intact.     Conjunctiva/sclera: Conjunctivae normal.     Pupils: Pupils are equal, round, and reactive to light.  Neck:     Musculoskeletal: Full passive range of motion without pain, normal range of motion and neck supple. No edema or erythema.     Trachea: Trachea and phonation normal. No tracheal deviation.  Cardiovascular:     Rate and Rhythm: Normal rate and regular rhythm.     Pulses: Normal pulses.          Dorsalis pedis pulses are 2+ on the right side and 2+ on the left side.       Posterior tibial pulses are 2+ on the right side and 2+ on the left side.     Heart sounds: Normal heart sounds.  Pulmonary:     Effort: Pulmonary effort is normal. No respiratory distress.     Breath sounds: Normal breath sounds and air entry. No rhonchi.  Chest:     Chest wall: No deformity, tenderness or crepitus.  Abdominal:     General: Abdomen is flat.     Palpations: Abdomen is soft.     Tenderness: There is no abdominal tenderness. There is no guarding or rebound.    Genitourinary:    Comments: Deferred by patient Musculoskeletal: Normal range of motion.     Right lower leg: Normal.     Left lower leg: Normal.  Feet:     Right foot:     Protective Sensation: 3 sites tested. 3 sites sensed.     Left foot:     Protective Sensation: 3 sites tested. 3 sites sensed.  Lymphadenopathy:     Cervical: Cervical adenopathy present.     Right cervical: Superficial cervical adenopathy present.     Left cervical: Superficial  cervical adenopathy present.  Skin:    General: Skin is warm and dry.     Capillary Refill: Capillary refill takes less than 2 seconds.  Neurological:     General: No focal deficit present.     Mental Status: She is alert.     GCS: GCS eye subscore is 4. GCS verbal subscore is 5. GCS motor subscore is 6.     Comments: Speech is clear and goal oriented, follows commands Major Cranial nerves without deficit, no facial droop Normal strength in upper and lower extremities bilaterally including dorsiflexion and plantar flexion, strong and equal grip strength Sensation normal to light touch Moves extremities without ataxia, coordination intact Normal gait  Psychiatric:        Mood and Affect: Mood normal.        Behavior: Behavior normal.    ED Treatments / Results  Labs (all labs ordered are listed, but only abnormal results are displayed) Labs Reviewed  GROUP A STREP BY PCR  INFLUENZA PANEL BY PCR (TYPE A & B)  POC URINE PREG, ED    EKG None  Radiology Dg Chest 2 View  Result Date: 01/12/2018 CLINICAL DATA:  Patient with generalized body aches EXAM: CHEST - 2 VIEW COMPARISON:  Chest radiograph 07/25/2017 FINDINGS: Normal cardiac and mediastinal contours. No consolidative pulmonary opacities. No pleural effusion or pneumothorax. Regional skeleton is unremarkable. IMPRESSION: No acute cardiopulmonary process. Electronically Signed   By: Annia Belt M.D.   On: 01/12/2018 10:07    Procedures Procedures (including critical care  time)  Medications Ordered in ED Medications  acetaminophen (TYLENOL) tablet 650 mg (650 mg Oral Given 01/12/18 1038)     Initial Impression / Assessment and Plan / ED Course  I have reviewed the triage vital signs and the nursing notes.  Pertinent labs & imaging results that were available during my care of the patient were reviewed by me and considered in my medical decision making (see chart for details).    Patient presenting with 1 week of upper respiratory tract infection, initially improved and worsening and over the past 1 day.  One episode of nonbloody/nonbilious vomiting this morning.  No abdominal pain, chest pain or shortness of breath.  Patient eating and drinking without recurrent nausea or vomiting.  Chest x-ray negative Influenza panel negative Strep test negative Urine pregnancy test negative  Patient is afebrile without tonsillar exudate, negative strep. Presents with mild cervical lymphadenopathy & dysphagia; diagnosis of viral pharyngitis. No antibiotics indicated at this time.  Patient does not appear dehydrated, but did discuss importance of water rehydration. Presentation non-concerning for peritonsillar abscess, Ludwig's angina or retropharyngeal abscess. Patient is without trismus or uvula deviation.  Patient is able to drink water in ED without difficulty with intact air way.  Symptoms are likely of viral etiology. Discussed that antibiotics are not indicated for viral infections. Patient will be discharged with symptomatic treatment. Patient verbalizes understanding and is agreeable with plan. Patient is hemodynamically stable and in no acute distress prior to discharge.  Patient prescribed viscous lidocaine rinse and Tessalon for symptomatic treatment.  Encourage fluid intake.  OTC anti-inflammatories, Tylenol/ibuprofen discussed as directed on the packaging.  Afebrile, not tachycardic, not hypotensive, SPO2 100% on room air, well-appearing and in no acute  distress.  At this time there does not appear to be any evidence of an acute emergency medical condition and the patient appears stable for discharge with appropriate outpatient follow up. Diagnosis was discussed with patient who verbalizes understanding of  care plan and is agreeable to discharge. I have discussed return precautions with patient who verbalizes understanding of return precautions. Patient strongly encouraged to follow-up with their PCP this week. All questions answered.  Patient's case discussed with Dr. Dalene SeltzerSchlossman who agrees with plan to discharge with follow-up.   Note: Portions of this report may have been transcribed using voice recognition software. Every effort was made to ensure accuracy; however, inadvertent computerized transcription errors may still be present. Final Clinical Impressions(s) / ED Diagnoses   Final diagnoses:  Viral upper respiratory tract infection with cough    ED Discharge Orders         Ordered    benzonatate (TESSALON) 100 MG capsule  Every 8 hours     01/12/18 1209    lidocaine (XYLOCAINE) 2 % solution  As needed     01/12/18 1209           Elizabeth PalauMorelli, Yoltzin Ransom A, PA-C 01/12/18 1232    Alvira MondaySchlossman, Erin, MD 01/14/18 [phone]

## 2018-01-12 NOTE — ED Triage Notes (Signed)
Pt c/o generalized body aches and emesis x 1 this morning.

## 2018-01-12 NOTE — Discharge Instructions (Addendum)
You have been diagnosed today with viral upper respiratory tract infection with cough.  At this time there does not appear to be the presence of an emergent medical condition, however there is always the potential for conditions to change. Please read and follow the below instructions.  Please return to the Emergency Department immediately for any new or worsening symptoms or if your symptoms do not improve within 3 days. Please be sure to follow up with your Primary Care Provider as soon as possible regarding your visit today; please call their office to schedule an appointment even if you are feeling better for a follow-up visit. You may use the viscous lidocaine mouth rinse as prescribed to help with your sore throat.  Do not swallow the lidocaine mouth rinse. Additionally you may use the Tessalon as prescribed to help with your cough.  You may use over-the-counter anti-inflammatories such as ibuprofen/Tylenol as directed on the packaging to help with your body aches. Please drink plenty water and get plenty of rest over the next few days to help with your symptoms.  Get help right away if: You feel pain or pressure in your chest. You have shortness of breath. You faint or feel like you will faint. You keep throwing up (vomiting). You feel confused. Your symptoms get worse over time. You have a fever. You have very bad pain in your face or forehead. Parts of your jaw or neck become very swollen.  Get help right away if: You cough up blood. You have difficulty breathing. Your heartbeat is very fast. You have new symptoms. You cough up pus. Your cough does not get better after 2-3 weeks, or your cough gets worse. You cannot control your cough with suppressant medicines and you are losing sleep. You develop pain that is getting worse or pain that is not controlled with pain medicines. You have a fever. You have unexplained weight loss. You have night sweats.  Please read the  additional information packets attached to your discharge summary.  Do not take your medicine if  develop an itchy rash, swelling in your mouth or lips, or difficulty breathing.

## 2018-01-12 NOTE — ED Notes (Addendum)
This Clinical research associatewriter entered this pt's room, to administer tylenol and gather a strep and flu swab.  After explaining plan of care, pt demanded to get another nurse and would not allow this RN to care for pt.  Tim RN asked by Conservation officer, naturethis writer to give pt tylenol and gather strep and flu swab.

## 2018-02-27 ENCOUNTER — Other Ambulatory Visit: Payer: Self-pay

## 2018-02-27 ENCOUNTER — Ambulatory Visit (INDEPENDENT_AMBULATORY_CARE_PROVIDER_SITE_OTHER): Payer: Federal, State, Local not specified - PPO | Admitting: Internal Medicine

## 2018-02-27 ENCOUNTER — Encounter: Payer: Self-pay | Admitting: Internal Medicine

## 2018-02-27 VITALS — BP 128/72 | HR 78 | Temp 97.5°F | Ht 68.8 in | Wt 269.9 lb

## 2018-02-27 DIAGNOSIS — N3000 Acute cystitis without hematuria: Secondary | ICD-10-CM

## 2018-02-27 DIAGNOSIS — K5904 Chronic idiopathic constipation: Secondary | ICD-10-CM

## 2018-02-27 LAB — POCT URINALYSIS DIPSTICK
Bilirubin, UA: NEGATIVE
Blood, UA: NEGATIVE
Glucose, UA: NEGATIVE
KETONES UA: NEGATIVE
Nitrite, UA: NEGATIVE
PH UA: 7 (ref 5.0–8.0)
PROTEIN UA: NEGATIVE
Spec Grav, UA: 1.02 (ref 1.010–1.025)
UROBILINOGEN UA: 1 U/dL

## 2018-02-27 MED ORDER — CIPROFLOXACIN HCL 500 MG PO TABS: 500.0000 mg | ORAL_TABLET | Freq: Two times a day (BID) | ORAL | 0 refills | Status: AC

## 2018-02-27 MED ORDER — PHENAZOPYRIDINE HCL 200 MG PO TABS: 200.0000 mg | ORAL_TABLET | Freq: Three times a day (TID) | ORAL | 0 refills | Status: AC

## 2018-02-27 NOTE — Patient Instructions (Signed)
GET MAGNESIUM CITRATE AND DRINK THE WHOLE BOTTLE TO HELP EMPTY YOUR COLON   IF YOU GET WORSE IN THEN NEXT 24-48 H, GO TO ER BECAUSE YOU WILL NEED TO HAVE MORE TEST DONE  Urinary Tract Infection, Adult A urinary tract infection (UTI) is an infection of any part of the urinary tract. The urinary tract includes:  The kidneys.  The ureters.  The bladder.  The urethra. These organs make, store, and get rid of pee (urine) in the body. What are the causes? This is caused by germs (bacteria) in your genital area. These germs grow and cause swelling (inflammation) of your urinary tract. What increases the risk? You are more likely to develop this condition if:  You have a small, thin tube (catheter) to drain pee.  You cannot control when you pee or poop (incontinence).  You are female, and: ? You use these methods to prevent pregnancy: ? A medicine that kills sperm (spermicide). ? A device that blocks sperm (diaphragm). ? You have low levels of a female hormone (estrogen). ? You are pregnant.  You have genes that add to your risk.  You are sexually active.  You take antibiotic medicines.  You have trouble peeing because of: ? A prostate that is bigger than normal, if you are female. ? A blockage in the part of your body that drains pee from the bladder (urethra). ? A kidney stone. ? A nerve condition that affects your bladder (neurogenic bladder). ? Not getting enough to drink. ? Not peeing often enough.  You have other conditions, such as: ? Diabetes. ? A weak disease-fighting system (immune system). ? Sickle cell disease. ? Gout. ? Injury of the spine. What are the signs or symptoms? Symptoms of this condition include:  Needing to pee right away (urgently).  Peeing often.  Peeing small amounts often.  Pain or burning when peeing.  Blood in the pee.  Pee that smells bad or not like normal.  Trouble peeing.  Pee that is cloudy.  Fluid coming from the vagina,  if you are female.  Pain in the belly or lower back. Other symptoms include:  Throwing up (vomiting).  No urge to eat.  Feeling mixed up (confused).  Being tired and grouchy (irritable).  A fever.  Watery poop (diarrhea). How is this treated? This condition may be treated with:  Antibiotic medicine.  Other medicines.  Drinking enough water. Follow these instructions at home:  Medicines  Take over-the-counter and prescription medicines only as told by your doctor.  If you were prescribed an antibiotic medicine, take it as told by your doctor. Do not stop taking it even if you start to feel better. General instructions  Make sure you: ? Pee until your bladder is empty. ? Do not hold pee for a long time. ? Empty your bladder after sex. ? Wipe from front to back after pooping if you are a female. Use each tissue one time when you wipe.  Drink enough fluid to keep your pee pale yellow.  Keep all follow-up visits as told by your doctor. This is important. Contact a doctor if:  You do not get better after 1-2 days.  Your symptoms go away and then come back. Get help right away if:  You have very bad back pain.  You have very bad pain in your lower belly.  You have a fever.  You are sick to your stomach (nauseous).  You are throwing up. Summary  A urinary tract infection (  UTI) is an infection of any part of the urinary tract.  This condition is caused by germs in your genital area.  There are many risk factors for a UTI. These include having a small, thin tube to drain pee and not being able to control when you pee or poop.  Treatment includes antibiotic medicines for germs.  Drink enough fluid to keep your pee pale yellow. This information is not intended to replace advice given to you by your health care provider. Make sure you discuss any questions you have with your health care provider. Document Released: 06/21/2007 Document Revised: 07/12/2017  Document Reviewed: 07/12/2017 Elsevier Interactive Patient Education  2019 [employer].

## 2018-02-27 NOTE — Progress Notes (Signed)
* Subjective:     Patient ID: Tiffany Tiffany [name] , female    DOB: 11-Sep-1995 , 22 y.o.   MRN: [id number]   Chief Complaint  Patient presents with  . Abdominal Pain    1 day/ yellow urine/     HPI  Onset of suprapubic pain x 1 d and has L flank pain.  Had UTI in December. And at that time she had neg STD's LMP end of Jan to early Feb. Has been with same sexual partner with whom she has him wear condoms. Has hx of chronic constipation.    Past Medical History:  Diagnosis Date  . Migraine      Family History  Problem Relation Age of Onset  . Hypertension Mother   . Hypertension Father   . Breast cancer Maternal Grandfather      Current Outpatient Medications:  .  acetaZOLAMIDE (DIAMOX) 500 MG capsule, Take 1 capsule (500 mg total) by mouth 2 (two) times Tiffany [name], Disp: 60 capsule, Rfl: 11 .  benzonatate (TESSALON) 100 MG capsule, Take 1 capsule (100 mg total) by mouth every 8 (eight) hours. (Patient not taking: Reported on 02/27/2018), Disp: 15 capsule, Rfl: 0 .  lidocaine (XYLOCAINE) 2 % solution, Use as directed 15 mLs in the mouth or throat as needed for mouth pain (Do NOT swallow). (Patient not taking: Reported on 02/27/2018), Disp: 100 mL, Rfl: 0 .  Pseudoephedrine-APAP-DM (DAYQUIL PO), Take 1 tablet by mouth Tiffany [name] as needed (congestion)., Disp: , Rfl:    Allergies  Allergen Reactions  . Penicillins Hives and Other (See Comments)    Has patient had a PCN reaction causing immediate rash, facial/tongue/throat swelling, SOB or lightheadedness with hypotension:unsure Has patient had a PCN reaction causing severe rash involving mucus membranes or skin necrosis:unsure Has patient had a PCN reaction that required hospitalization:unsure Has patient had a PCN reaction occurring within the last 10 years:No Childhood Reaction - rash If all of the above answers are "NO", then may proceed with Cephalosporin use.      Review of Systems  Neurological: Positive for headaches.     HA since today,      Today's Vitals   02/27/18 1432  BP: 128/72  Pulse: 78  Temp: (!) 97.5 F (36.4 C)  TempSrc: Oral  SpO2: 98%  Weight: 269 lb 14.4 oz (122.4 kg)  Height: 5' 8.8" (1.748 m)   Body mass index is 40.09 kg/m.   Objective:  Physical Exam Vitals signs and nursing note reviewed.  Constitutional:      General: She is not in acute distress.    Appearance: She is obese. She is not toxic-appearing.  HENT:     Head: Normocephalic.  Eyes:     Extraocular Movements: Extraocular movements intact.  Pulmonary:     Effort: Pulmonary effort is normal.  Abdominal:     General: Abdomen is protuberant. Bowel sounds are normal. There is distension and abdominal bruit.     Palpations: There is no hepatomegaly or splenomegaly.     Tenderness: There is no right CVA tenderness, left CVA tenderness, guarding or rebound. Negative signs include psoas sign.     Comments: Has diffuse tenderness all over, but worse over suprapubic region.  Skin:    General: Skin is warm and dry.     Findings: No erythema or rash.  Neurological:     Mental Status: She is alert and oriented to person, place, and time.  Psychiatric:        Mood  and Affect: Mood normal.        Behavior: Behavior normal.    UA-  With moderate leuks.     Assessment And Plan:     1. Acute cystitis without hematuria- acute. I placed her on Cipro 500 mg bid x 5 days Urine was sent for a culture  - Culture, Urine  2. Chronic idiopathic constipation- chronic. Advised to get Citric Mag and drink it today to empty her colon.   If she gets worse in 24-48h needs to go to ER since she will need imaging. She may call me tomorrow, if she has any questions.   She will come next week for FU obesity. The phentermine helped her decrease her appetite.   Lundyn Coste RODRIGUEZ-SOUTHWORTH, PA-C

## 2018-02-28 LAB — URINE CULTURE

## 2018-03-07 ENCOUNTER — Ambulatory Visit (INDEPENDENT_AMBULATORY_CARE_PROVIDER_SITE_OTHER): Payer: Federal, State, Local not specified - PPO | Admitting: Internal Medicine

## 2018-03-07 ENCOUNTER — Encounter: Payer: Self-pay | Admitting: Internal Medicine

## 2018-03-07 VITALS — BP 122/80 | HR 80 | Temp 98.1°F | Ht 69.0 in | Wt 269.8 lb

## 2018-03-07 DIAGNOSIS — Z6839 Body mass index (BMI) 39.0-39.9, adult: Secondary | ICD-10-CM

## 2018-03-07 DIAGNOSIS — E669 Obesity, unspecified: Secondary | ICD-10-CM

## 2018-03-07 MED ORDER — PHENTERMINE HCL 37.5 MG PO CAPS: 37.5000 mg | ORAL_CAPSULE | ORAL | 0 refills | Status: DC

## 2018-03-07 NOTE — Progress Notes (Signed)
Subjective:     Patient ID: Tiffany [name] , female    DOB: May 10, 1995 , 23 y.o.   MRN: [id number]   Chief Complaint  Patient presents with  . Obesity    HPI  Pt is here  to get re-established to work on loosing wt. Has been dealing with her HA's and consulted with her neurologist if OK to get back on the Phentermine and the neurologist said yes.  Has seen a nutrionist who has given her some tips of food choices.  Tolerated phentermine well. 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 GOAL-  Wants to be size 10 Highest wt- 276 ln Lowest wt- 220 lb Meds tried for wt loss- phentermine which she tolerated well, only got thirsty but no palpitations or constipations issues.  Future Goal- exercise 3/week for 30 min.   Past Medical History:  Diagnosis Date  . Migraine      Family History  Problem Relation Age of Onset  . Hypertension Mother   . Hypertension Father   . Breast cancer Maternal Grandfather      Current Outpatient Medications:  .  acetaZOLAMIDE (DIAMOX) 500 MG capsule, Take 1 capsule (500 mg total) by mouth 2 (two) times [name], Disp: 60 capsule, Rfl: 11   Allergies  Allergen Reactions  . Penicillins Hives and Other (See Comments)    Has patient had a PCN reaction causing immediate rash, facial/tongue/throat swelling, SOB or lightheadedness with hypotension:unsure Has patient had a PCN reaction causing severe rash involving mucus membranes or skin necrosis:unsure Has patient had a PCN reaction that required hospitalization:unsure Has patient had a PCN reaction occurring within the last 10 years:No Childhood Reaction - rash If all of the above answers are "NO", then may proceed with Cephalosporin use.      Review of Systems  Constitutional: Negative for appetite change, chills, diaphoresis, fatigue and fever.  HENT: Negative for congestion.   Respiratory: Negative for cough and shortness of breath.   Cardiovascular: Negative for chest  pain, palpitations and leg swelling.  Endocrine: Negative for cold intolerance, heat intolerance, polydipsia and polyphagia.  Genitourinary: Negative for difficulty urinating.  Musculoskeletal: Negative for arthralgias.  Skin: Negative for rash.  Neurological: Negative for dizziness.  Hematological: Negative for adenopathy.     Today's Vitals   03/07/18 1026  BP: 122/80  Pulse: 80  Temp: 98.1 F (36.7 C)  TempSrc: Oral  Weight: 269 lb 12.8 oz (122.4 kg)  Height: 5\' 9"  (1.753 m)  PainSc: 0-No pain   Body mass index is 39.84 kg/m.   Objective:  Physical Exam Vitals signs and nursing note reviewed.  Constitutional:      General: She is not in acute distress.    Appearance: She is obese. She is not toxic-appearing.  HENT:     Head: Normocephalic.     Right Ear: External ear normal.     Left Ear: External ear normal.     Nose: Nose normal.     Mouth/Throat:     Pharynx: Oropharynx is clear.  Eyes:     General: No scleral icterus.    Conjunctiva/sclera: Conjunctivae normal.     Pupils: Pupils are equal, round, and reactive to light.  Neck:     Musculoskeletal: Neck supple. No neck rigidity.  Cardiovascular:     Rate and Rhythm: Normal rate and regular rhythm.     Heart sounds: No murmur.  Pulmonary:     Effort: Pulmonary effort is normal.  Breath sounds: Normal breath sounds.  Musculoskeletal: Normal range of motion.  Lymphadenopathy:     Cervical: No cervical adenopathy.  Skin:    General: Skin is warm and dry.  Neurological:     Mental Status: She is alert and oriented to person, place, and time.     Motor: No weakness.     Gait: Gait normal.  Psychiatric:        Mood and Affect: Mood normal.        Behavior: Behavior normal.        Thought Content: Thought content normal.        Judgment: Judgment normal.     Assessment And Plan:   1. Obesity (BMI 35.0-39.9 without comorbidity)- chronic. I restarted her back on Phentermine( Rx # 1). I will see her in 1  month for wt check. Told she can only take this for 6 months, and also in the mean time since she tends to gain her wt back when off this med, to look into which medications her insurance is willing to cover for long term wt loss.      Marysue Fait RODRIGUEZ-SOUTHWORTH, PA-C

## 2018-03-13 DIAGNOSIS — Z23 Encounter for immunization: Secondary | ICD-10-CM | POA: Diagnosis not present

## 2018-03-26 DIAGNOSIS — Z113 Encounter for screening for infections with a predominantly sexual mode of transmission: Secondary | ICD-10-CM | POA: Diagnosis not present

## 2018-04-04 ENCOUNTER — Ambulatory Visit: Payer: Federal, State, Local not specified - PPO | Admitting: Internal Medicine

## 2018-04-15 ENCOUNTER — Encounter (HOSPITAL_COMMUNITY): Payer: Self-pay | Admitting: *Deleted

## 2018-04-15 ENCOUNTER — Emergency Department (HOSPITAL_COMMUNITY)
Admission: EM | Admit: 2018-04-15 | Discharge: 2018-04-15 | Disposition: A | Discharge status: Home / Self Care | Payer: Federal, State, Local not specified - PPO | Attending: Emergency Medicine | Admitting: Emergency Medicine

## 2018-04-15 ENCOUNTER — Other Ambulatory Visit: Payer: Self-pay

## 2018-04-15 DIAGNOSIS — R197 Diarrhea, unspecified: Secondary | ICD-10-CM | POA: Insufficient documentation

## 2018-04-15 DIAGNOSIS — R112 Nausea with vomiting, unspecified: Secondary | ICD-10-CM | POA: Diagnosis not present

## 2018-04-15 DIAGNOSIS — J029 Acute pharyngitis, unspecified: Secondary | ICD-10-CM | POA: Insufficient documentation

## 2018-04-15 DIAGNOSIS — R1011 Right upper quadrant pain: Secondary | ICD-10-CM | POA: Diagnosis not present

## 2018-04-15 DIAGNOSIS — R1084 Generalized abdominal pain: Secondary | ICD-10-CM | POA: Diagnosis not present

## 2018-04-15 DIAGNOSIS — Z79899 Other long term (current) drug therapy: Secondary | ICD-10-CM | POA: Insufficient documentation

## 2018-04-15 LAB — COMPREHENSIVE METABOLIC PANEL
ALK PHOS: 78 U/L (ref 38–126)
ALT: 20 U/L (ref 0–44)
ANION GAP: 8 (ref 5–15)
AST: 16 U/L (ref 15–41)
Albumin: 3.4 g/dL — ABNORMAL LOW (ref 3.5–5.0)
BILIRUBIN TOTAL: 0.8 mg/dL (ref 0.3–1.2)
BUN: <5 mg/dL — ABNORMAL LOW (ref 6–20)
CALCIUM: 9.2 mg/dL (ref 8.9–10.3)
CO2: 25 mmol/L (ref 22–32)
CREATININE: 0.57 mg/dL (ref 0.44–1.00)
Chloride: 101 mmol/L (ref 98–111)
GFR calc Af Amer: >60 mL/min (ref >60)
Glucose, Bld: 95 mg/dL (ref 70–99)
Potassium: 3.4 mmol/L — ABNORMAL LOW (ref 3.5–5.1)
SODIUM: 134 mmol/L — AB (ref 135–145)
TOTAL PROTEIN: 7.2 g/dL (ref 6.5–8.1)

## 2018-04-15 LAB — PREGNANCY, URINE: PREG TEST UR: NEGATIVE

## 2018-04-15 LAB — CBC WITH DIFFERENTIAL/PLATELET
Abs Immature Granulocytes: 0.04 10*3/uL (ref 0.00–0.07)
BASOS ABS: 0 10*3/uL (ref 0.0–0.1)
Basophils Relative: 0 %
EOS PCT: 0 %
Eosinophils Absolute: 0 10*3/uL (ref 0.0–0.5)
HCT: 35.5 % — ABNORMAL LOW (ref 36.0–46.0)
Hemoglobin: 11.2 g/dL — ABNORMAL LOW (ref 12.0–15.0)
Immature Granulocytes: 0 %
Lymphocytes Relative: 22 %
Lymphs Abs: 3.1 10*3/uL (ref 0.7–4.0)
MCH: 28.7 pg (ref 26.0–34.0)
MCHC: 31.5 g/dL (ref 30.0–36.0)
MCV: 91 fL (ref 80.0–100.0)
MONO ABS: 1 10*3/uL (ref 0.1–1.0)
Monocytes Relative: 7 %
NRBC: 0 % (ref 0.0–0.2)
Neutro Abs: 9.7 10*3/uL — ABNORMAL HIGH (ref 1.7–7.7)
Neutrophils Relative %: 71 %
Platelets: 383 10*3/uL (ref 150–400)
RBC: 3.9 MIL/uL (ref 3.87–5.11)
RDW: 12.7 % (ref 11.5–15.5)
WBC: 13.8 10*3/uL — AB (ref 4.0–10.5)

## 2018-04-15 LAB — URINALYSIS, ROUTINE W REFLEX MICROSCOPIC
BILIRUBIN URINE: NEGATIVE
Glucose, UA: NEGATIVE mg/dL
Hgb urine dipstick: NEGATIVE
Ketones, ur: NEGATIVE mg/dL
Leukocytes,Ua: NEGATIVE
Nitrite: NEGATIVE
PH: 6 (ref 5.0–8.0)
Protein, ur: NEGATIVE mg/dL
SPECIFIC GRAVITY, URINE: 1.02 (ref 1.005–1.030)

## 2018-04-15 LAB — LIPASE, BLOOD: LIPASE: 21 U/L (ref 11–51)

## 2018-04-15 MED ORDER — DICYCLOMINE HCL 20 MG PO TABS: 20.0000 mg | ORAL_TABLET | Freq: Three times a day (TID) | ORAL | 0 refills | Status: DC

## 2018-04-15 MED ORDER — ONDANSETRON 4 MG PO TBDP: 4.0000 mg | ORAL_TABLET | Freq: Three times a day (TID) | ORAL | 0 refills | Status: DC | PRN

## 2018-04-15 MED ORDER — ONDANSETRON 4 MG PO TBDP
4.0000 mg | ORAL_TABLET | Freq: Once | ORAL | Status: AC
  Administered 2018-04-15: 4 mg via ORAL
  Filled 2018-04-15: qty 1

## 2018-04-15 MED ORDER — DICYCLOMINE HCL 20 MG PO TABS
20.0000 mg | ORAL_TABLET | Freq: Once | ORAL | Status: AC
  Administered 2018-04-15: 20 mg via ORAL
  Filled 2018-04-15: qty 1

## 2018-04-15 NOTE — ED Triage Notes (Signed)
ABD pain started today  With one episode  of diarrhea this AM.

## 2018-04-15 NOTE — Discharge Instructions (Signed)
Please consider taking a daily allergy medication to help with your symptoms.  I suggest a less drowsy 24 hour medication such as allegra, zyrtec or Claritin or the generic version.  You can also take 1 to 2 pills of Benadryl at night.  This will make you drowsy so you should not drive, operate heavy machinery, or perform any other tasks that would be dangerous to you or anyone else.  If this does not improve your symptoms then you should remain out of work for a minimum of 7 days, at least 3 days without symptoms.  Please make sure that you get a thermometer and monitor yourself.  If you develop cough, fever (temp over 100.4) or shortness of breath then you need to remain home and quarantine.

## 2018-04-15 NOTE — ED Provider Notes (Signed)
[facility] EMERGENCY DEPARTMENT Provider Note   CSN: [id number] Arrival date & time: 04/15/18  1536    History   Chief Complaint Chief Complaint  Patient presents with   Abdominal Pain    HPI Tiffany [name] is a 23 y.o. female with a past medical history of IH, who presents today for evaluation of abdominal pain.  She reports that her pain started yesterday morning and was slight.  She has vomited 3 times today and had diarrhea once today.  She denies any blood in her vomit or diarrhea.  She has never had anything similar.  She also reports no dysuria, increased frequency or urgency.  She denies any vaginal discharge.  She has never had any abdominal surgeries before.    She reports that she has seasonal allergies, she says that she has had increased nasal congestion and sore throat.  She denies cough, shortness of breath, or fevers.  She has not had any contact with anyone known to have coronavirus.      HPI  Past Medical History:  Diagnosis Date   Migraine     Patient Active Problem List   Diagnosis Date Noted   IIH (idiopathic intracranial hypertension) 11/22/2017   Chronic intractable headache 08/23/2017    Past Surgical History:  Procedure Laterality Date   DILATION AND CURETTAGE OF UTERUS     KNEE SURGERY Left    lumbar puncture  2019     OB History    Gravida  0   Para  0   Term  0   Preterm  0   AB  0   Living  0     SAB  0   TAB  0   Ectopic  0   Multiple  0   Live Births  0            Home Medications    Prior to Admission medications   Medication Sig Start Date End Date Taking? Authorizing Provider  acetaZOLAMIDE (DIAMOX) 500 MG capsule Take 1 capsule (500 mg total) by mouth 2 (two) times daily. 10/12/17   Anson Fret, MD  dicyclomine (BENTYL) 20 MG tablet Take 1 tablet (20 mg total) by mouth 4 (four) times daily -  before meals and at bedtime for 7 days. 04/15/18 04/22/18  Cristina Gong, PA-C  ondansetron (ZOFRAN ODT) 4 MG disintegrating tablet Take 1 tablet (4 mg total) by mouth every 8 (eight) hours as needed for nausea or vomiting. 04/15/18   Cristina Gong, PA-C  phentermine 37.5 MG capsule Take 1 capsule (37.5 mg total) by mouth every morning. 03/07/18   Rodriguez-Southworth, Nettie Elm, PA-C    Family History Family History  Problem Relation Age of Onset   Hypertension Mother    Hypertension Father    Breast cancer Maternal Grandfather     Social History Social History   Tobacco Use   Smoking status: Never Smoker   Smokeless tobacco: Never Used  Substance Use Topics   Alcohol use: No   Drug use: No     Allergies   Penicillins   Review of Systems Review of Systems  Constitutional: Negative for appetite change, chills, diaphoresis and fever.  HENT: Positive for congestion, ear pain and postnasal drip.   Respiratory: Negative for chest tightness and shortness of breath.   Cardiovascular: Negative for chest pain and palpitations.  Gastrointestinal: Positive for abdominal pain, diarrhea, nausea and vomiting.  Genitourinary: Negative for dysuria.  Musculoskeletal: Negative for  back pain and neck pain.  Skin: Negative for color change, rash and wound.  Neurological: Negative for dizziness and weakness.  All other systems reviewed and are negative.    Physical Exam Updated Vital Signs BP (!) 146/99 (BP Location: Right Arm)    Pulse 65    Temp 98.3 F (36.8 C) (Oral)    Resp 16    Ht 5\' 8"  (1.727 m)    Wt 121 kg    SpO2 100%    BMI 40.56 kg/m   Physical Exam Vitals signs and nursing note reviewed.  Constitutional:      General: She is not in acute distress.    Appearance: She is well-developed. She is not diaphoretic.  HENT:     Head: Normocephalic and atraumatic.     Comments: Bilateral serous effusions. Eyes:     General: No scleral icterus.       Right eye: No discharge.        Left eye: No discharge.     Conjunctiva/sclera:  Conjunctivae normal.  Neck:     Musculoskeletal: Normal range of motion.  Cardiovascular:     Rate and Rhythm: Normal rate and regular rhythm.  Pulmonary:     Effort: Pulmonary effort is normal. No respiratory distress.     Breath sounds: No stridor.  Abdominal:     General: Abdomen is flat. Bowel sounds are normal. There is no distension.     Palpations: Abdomen is soft.     Tenderness: There is generalized abdominal tenderness and tenderness in the right upper quadrant, periumbilical area and left lower quadrant. There is no guarding or rebound. Negative signs include Murphy's sign and McBurney's sign.     Hernia: No hernia is present.  Genitourinary:    Comments: deferred Musculoskeletal:        General: No deformity.  Skin:    General: Skin is warm and dry.  Neurological:     General: No focal deficit present.     Mental Status: She is alert.     Motor: No abnormal muscle tone.  Psychiatric:        Behavior: Behavior normal.      ED Treatments / Results  Labs (all labs ordered are listed, but only abnormal results are displayed) Labs Reviewed  COMPREHENSIVE METABOLIC PANEL - Abnormal; Notable for the following components:      Result Value   Sodium 134 (*)    Potassium 3.4 (*)    BUN <5 (*)    Albumin 3.4 (*)    All other components within normal limits  CBC WITH DIFFERENTIAL/PLATELET - Abnormal; Notable for the following components:   WBC 13.8 (*)    Hemoglobin 11.2 (*)    HCT 35.5 (*)    Neutro Abs 9.7 (*)    All other components within normal limits  PREGNANCY, URINE  URINALYSIS, ROUTINE W REFLEX MICROSCOPIC  LIPASE, BLOOD    EKG None  Radiology No results found.  Procedures Procedures (including critical care time)  Medications Ordered in ED Medications  ondansetron (ZOFRAN-ODT) disintegrating tablet 4 mg (4 mg Oral Given 04/15/18 1741)  dicyclomine (BENTYL) tablet 20 mg (20 mg Oral Given 04/15/18 1746)     Initial Impression / Assessment and  Plan / ED Course  I have reviewed the triage vital signs and the nursing notes.  Pertinent labs & imaging results that were available during my care of the patient were reviewed by me and considered in my medical decision making (see chart  for details).       Patient with symptoms consistent with viral gastroenteritis.  Vitals are stable, no fever.  No signs of dehydration, tolerating PO fluids > 6 oz.  Lungs are clear.  No focal abdominal pain, no concern for appendicitis, cholecystitis, pancreatitis, ruptured viscus, UTI, kidney stone, or any other abdominal etiology.  She does not have any right lower quadrant pain consistent with appendicitis.  She has reassuring labs with no significant hematologic or electrolyte derangements that appear new.  She does not have any evidence of transaminitis or hepatobiliary pathology.  Supportive therapy indicated with return if symptoms worsen.   She does have some postnasal drainage, ear pain, and congestion which she attributes to allergies and says it has been present since before the abdominal pain started.  She is afebrile.  Recommended using nasal steroid spray in addition to antihistamines.  She is given prescriptions for Zofran ODT and Bentyl.  Return precautions were discussed with patient who states their understanding.  At the time of discharge patient denied any unaddressed complaints or concerns.  Patient is agreeable for discharge home.   Final Clinical Impressions(s) / ED Diagnoses   Final diagnoses:  Generalized abdominal pain  Nausea vomiting and diarrhea  Sore throat    ED Discharge Orders         Ordered    dicyclomine (BENTYL) 20 MG tablet  3 times daily before meals & bedtime     04/15/18 1828    ondansetron (ZOFRAN ODT) 4 MG disintegrating tablet  Every 8 hours PRN     04/15/18 1828           Cristina Gong, PA-C 04/15/18 1909    Melene Plan, DO 04/16/18 1103

## 2018-06-14 DIAGNOSIS — Z20828 Contact with and (suspected) exposure to other viral communicable diseases: Secondary | ICD-10-CM | POA: Diagnosis not present

## 2018-06-20 DIAGNOSIS — Z6839 Body mass index (BMI) 39.0-39.9, adult: Secondary | ICD-10-CM | POA: Diagnosis not present

## 2018-06-20 DIAGNOSIS — Z124 Encounter for screening for malignant neoplasm of cervix: Secondary | ICD-10-CM | POA: Diagnosis not present

## 2018-06-20 DIAGNOSIS — Z113 Encounter for screening for infections with a predominantly sexual mode of transmission: Secondary | ICD-10-CM | POA: Diagnosis not present

## 2018-06-20 DIAGNOSIS — Z1389 Encounter for screening for other disorder: Secondary | ICD-10-CM | POA: Diagnosis not present

## 2018-06-20 DIAGNOSIS — Z01419 Encounter for gynecological examination (general) (routine) without abnormal findings: Secondary | ICD-10-CM | POA: Diagnosis not present

## 2018-06-20 DIAGNOSIS — Z1151 Encounter for screening for human papillomavirus (HPV): Secondary | ICD-10-CM | POA: Diagnosis not present

## 2018-06-20 DIAGNOSIS — R87612 Low grade squamous intraepithelial lesion on cytologic smear of cervix (LGSIL): Secondary | ICD-10-CM | POA: Diagnosis not present

## 2018-08-28 ENCOUNTER — Encounter: Payer: No Typology Code available for payment source | Admitting: Internal Medicine

## 2018-08-28 DIAGNOSIS — R87619 Unspecified abnormal cytological findings in specimens from cervix uteri: Secondary | ICD-10-CM | POA: Diagnosis not present

## 2018-08-28 DIAGNOSIS — N72 Inflammatory disease of cervix uteri: Secondary | ICD-10-CM | POA: Diagnosis not present

## 2018-08-28 DIAGNOSIS — Z3202 Encounter for pregnancy test, result negative: Secondary | ICD-10-CM | POA: Diagnosis not present

## 2018-08-28 DIAGNOSIS — N871 Moderate cervical dysplasia: Secondary | ICD-10-CM | POA: Diagnosis not present

## 2018-08-28 DIAGNOSIS — N898 Other specified noninflammatory disorders of vagina: Secondary | ICD-10-CM | POA: Diagnosis not present

## 2018-09-05 ENCOUNTER — Encounter: Payer: Federal, State, Local not specified - PPO | Admitting: Internal Medicine

## 2018-10-11 DIAGNOSIS — N898 Other specified noninflammatory disorders of vagina: Secondary | ICD-10-CM | POA: Diagnosis not present

## 2018-10-11 DIAGNOSIS — D069 Carcinoma in situ of cervix, unspecified: Secondary | ICD-10-CM | POA: Diagnosis not present

## 2018-10-11 DIAGNOSIS — Z3202 Encounter for pregnancy test, result negative: Secondary | ICD-10-CM | POA: Diagnosis not present

## 2018-10-11 DIAGNOSIS — N72 Inflammatory disease of cervix uteri: Secondary | ICD-10-CM | POA: Diagnosis not present

## 2019-01-22 ENCOUNTER — Ambulatory Visit: Payer: Federal, State, Local not specified - PPO | Attending: Internal Medicine

## 2019-01-22 DIAGNOSIS — Z20822 Contact with and (suspected) exposure to covid-19: Secondary | ICD-10-CM

## 2019-01-24 LAB — NOVEL CORONAVIRUS, NAA: SARS-CoV-2, NAA: NOT DETECTED

## 2019-03-21 IMAGING — CT CT HEAD W/O CM
3 series · 15 of 47 positions shown, 18 images · non-contrast
Comparison: None.

CLINICAL DATA: Severe headache for several weeks.

EXAM:
CT HEAD WITHOUT CONTRAST
TECHNIQUE: Contiguous axial images were obtained from the base of the skull
through the vertex without intravenous contrast.

[Series 3: head wo · axial · 0.43mm/px · z∈[-2,+123]mm · 9 of 31 slices shown, 12 images]
[im 3/31  brain]
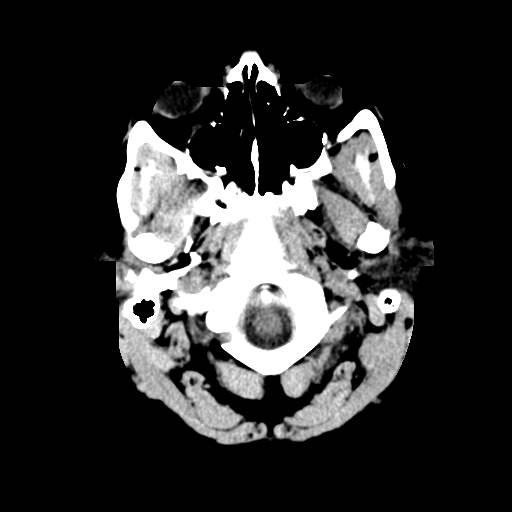
[im 3/31  bone]
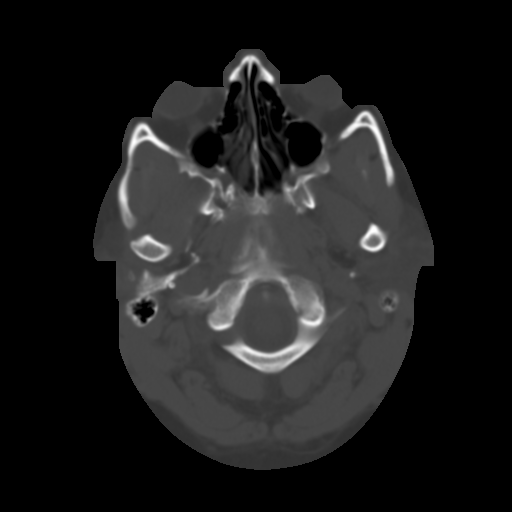
[im 6/31  brain]
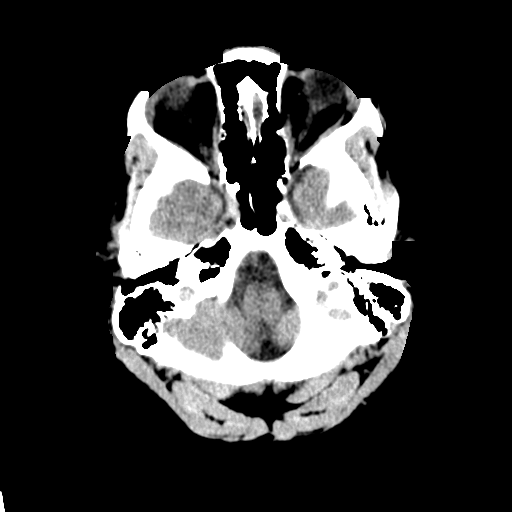
[im 9/31  brain]
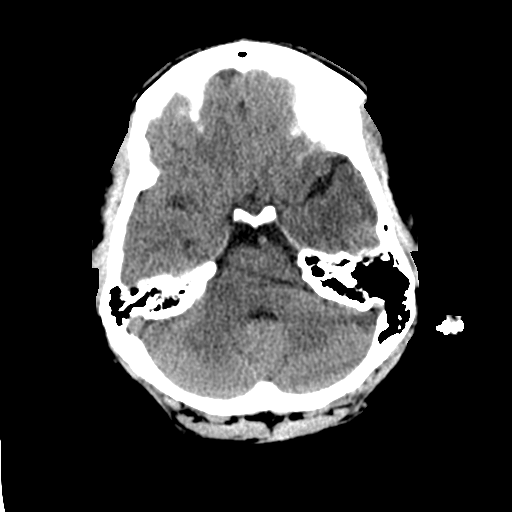
[im 12/31  brain]
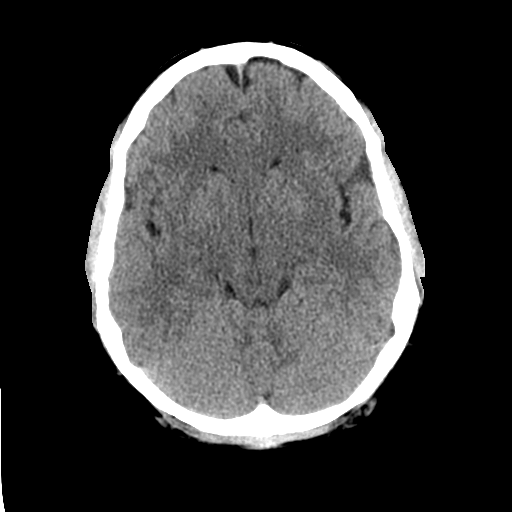
[im 16/31  brain]
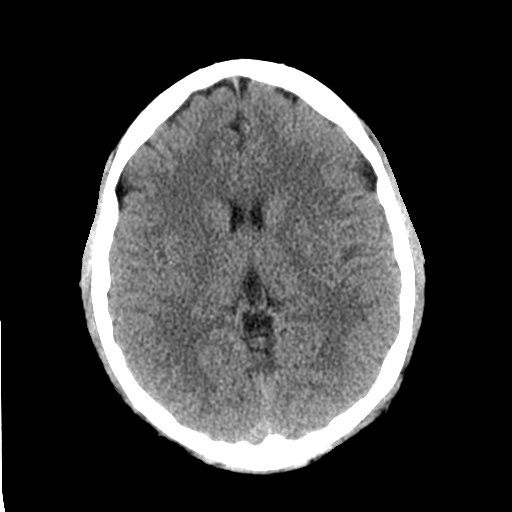
[im 16/31  bone]
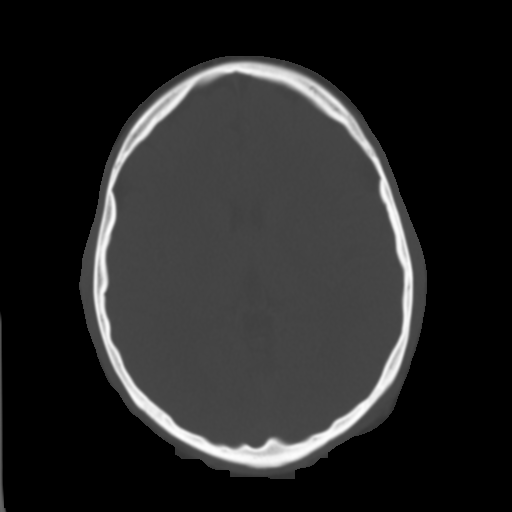
[im 19/31  brain]
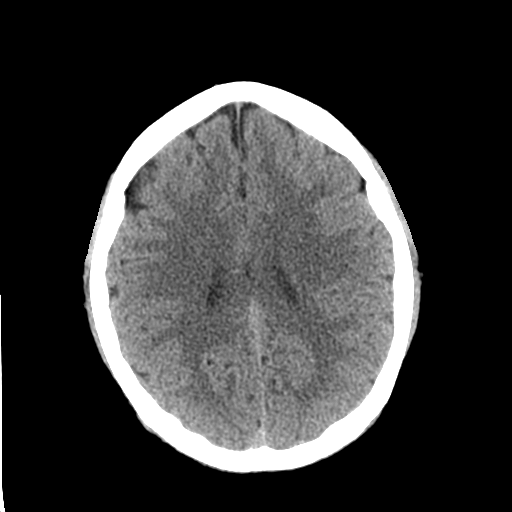
[im 22/31  brain]
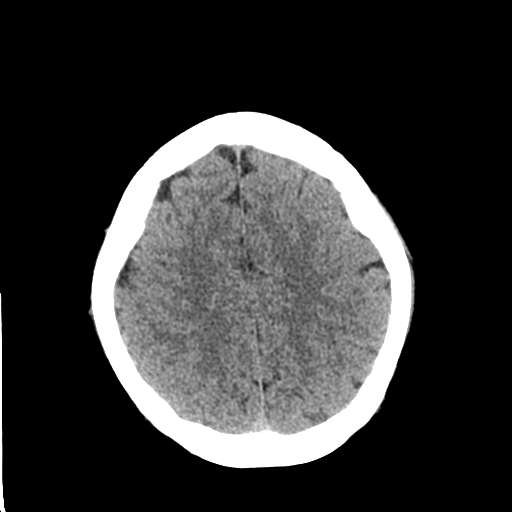
[im 25/31  brain]
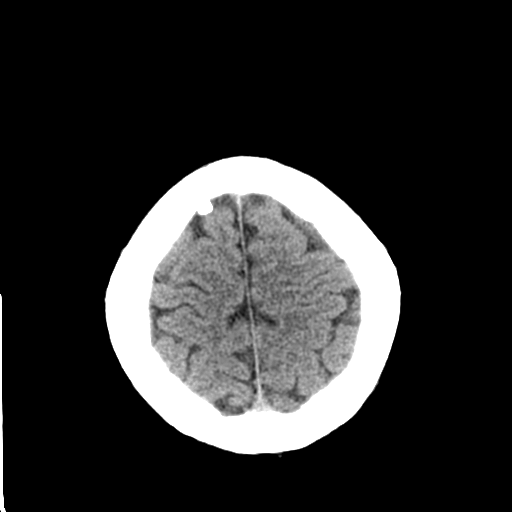
[im 28/31  brain]
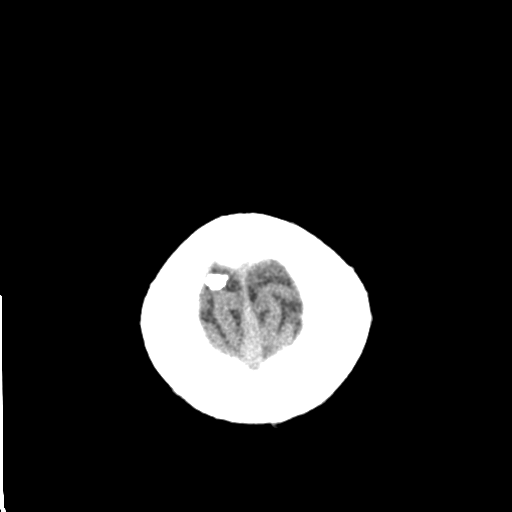
[im 28/31  bone]
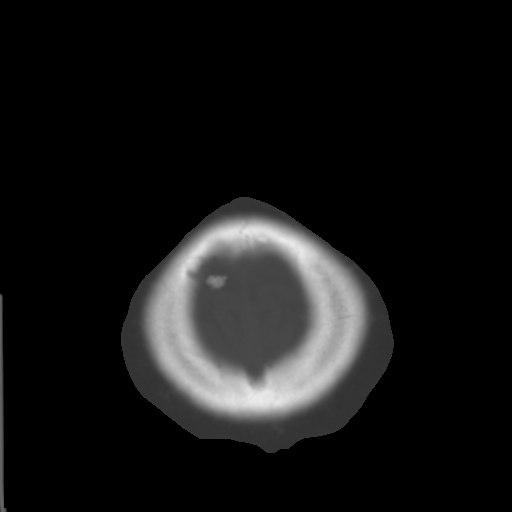

[Series 5: coronal soft tissue · coronal · 0.29mm/px · 3 of 64 slices shown]
[im 22/64  brain]
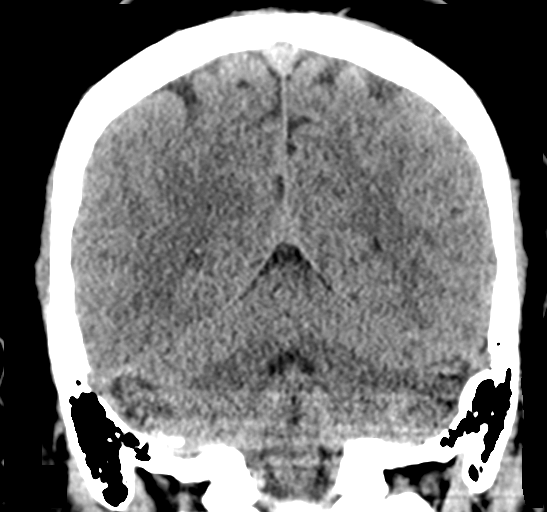
[im 29/64  brain]
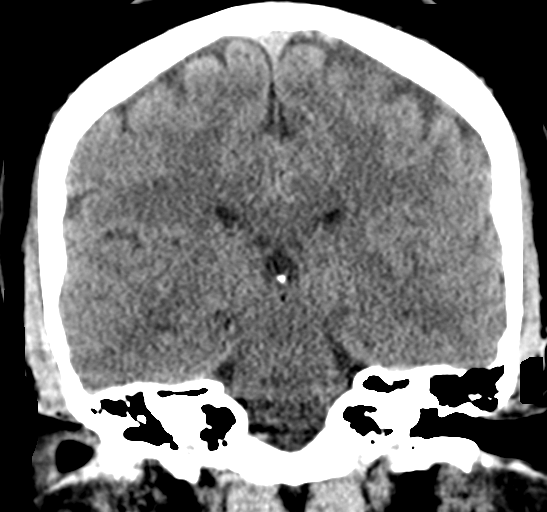
[im 36/64  brain]
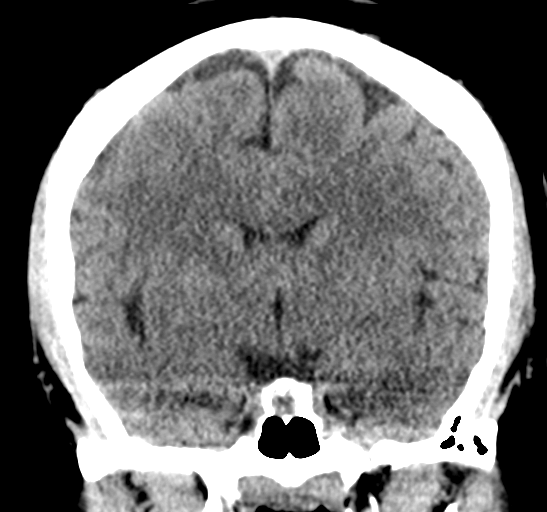

[Series 6: sagittal soft tissue · sagittal · 0.28mm/px · 3 of 51 slices shown]
[im 17/51  brain]
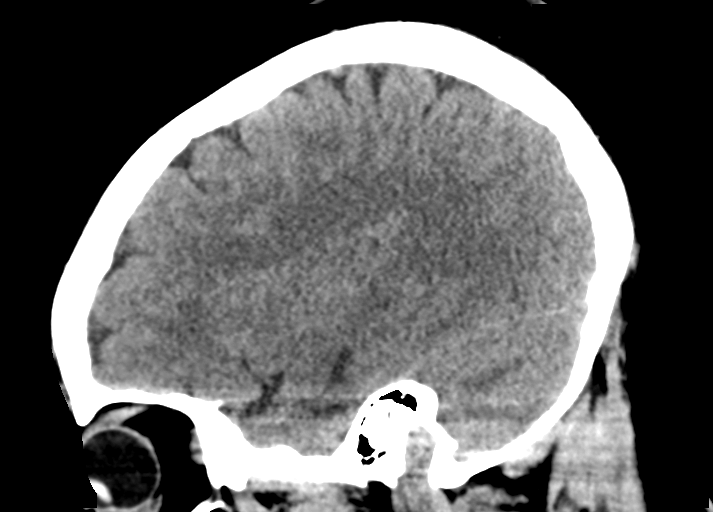
[im 26/51  brain]
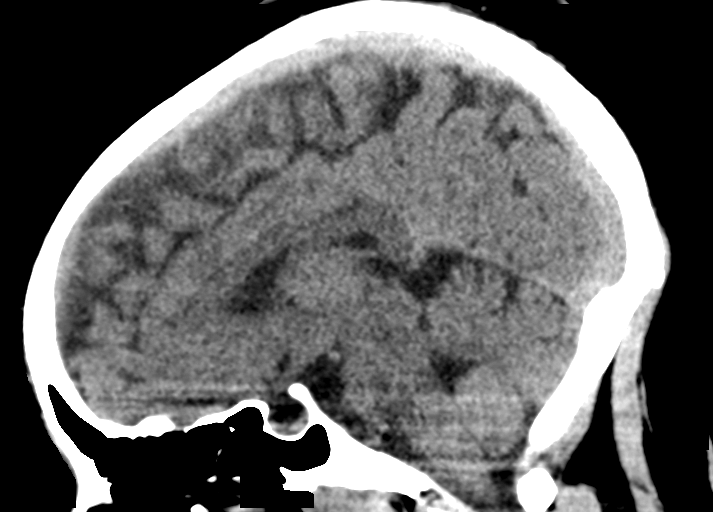
[im 34/51  brain]
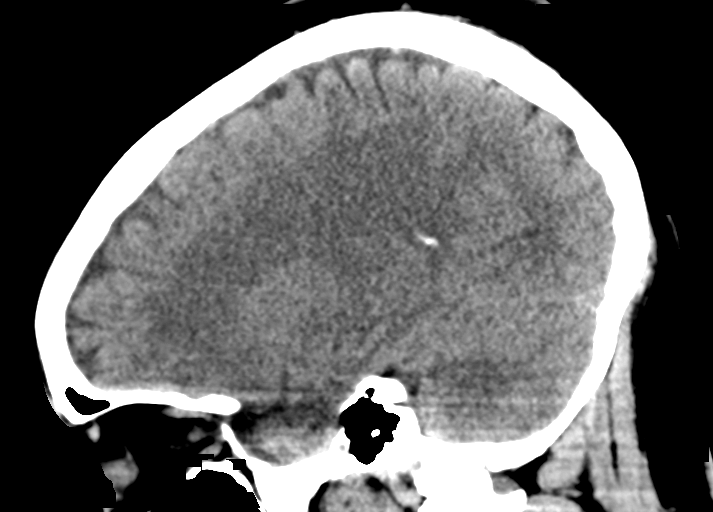

[15 of 47 positions shown; findings below may reference images not displayed]

FINDINGS: Brain: No evidence of acute infarction, hemorrhage, hydrocephalus,
extra-axial collection, or mass lesion/mass effect.

Vascular:  No hyperdense vessel or other acute findings.

Skull: No evidence of fracture or other significant bone
abnormality.

Sinuses/Orbits:  No acute findings.

Other: None.
IMPRESSION: Negative unenhanced head CT.

## 2019-04-10 ENCOUNTER — Encounter: Payer: Self-pay | Admitting: Internal Medicine

## 2019-04-11 DIAGNOSIS — N898 Other specified noninflammatory disorders of vagina: Secondary | ICD-10-CM | POA: Diagnosis not present

## 2019-04-11 DIAGNOSIS — L739 Follicular disorder, unspecified: Secondary | ICD-10-CM | POA: Diagnosis not present

## 2019-04-15 ENCOUNTER — Encounter: Payer: Self-pay | Admitting: Nurse Practitioner

## 2019-04-15 ENCOUNTER — Other Ambulatory Visit: Payer: Self-pay

## 2019-04-15 ENCOUNTER — Ambulatory Visit (INDEPENDENT_AMBULATORY_CARE_PROVIDER_SITE_OTHER): Payer: Federal, State, Local not specified - PPO | Admitting: Nurse Practitioner

## 2019-04-15 DIAGNOSIS — Z6841 Body Mass Index (BMI) 40.0 and over, adult: Secondary | ICD-10-CM

## 2019-04-15 MED ORDER — PHENTERMINE HCL 15 MG PO CAPS: 15.0000 mg | ORAL_CAPSULE | ORAL | 1 refills | Status: DC

## 2019-04-15 NOTE — Progress Notes (Signed)
This visit occurred during the SARS-CoV-2 public health emergency.  Safety protocols were in place, including screening questions prior to the visit, additional usage of staff PPE, and extensive cleaning of exam room while observing appropriate contact time as indicated for disinfecting solutions.  Subjective:     Patient ID: Tiffany [name] , female    DOB: Jun 15, 1995 , 24 y.o.   MRN: [id number]   Chief Complaint  Patient presents with  . Weight Check    HPI  Obesity - she had taken phentermine in the past she thinks 2019.  She was having headaches and stopped because she thought may be related to her headaches found.  She reports after taking Depo (2017) she gained approx 50 lbs the first year.  She was working from home initially.  During the pandemic she has gained 20 more pounds. She is going back to work where she will be moving around more.  She is doing no meat and tried Dr. Kyung Bacca diet.  No breakfast or lunch today. She will drink meal replacements. She is doing a detox tea and a fruit juice as a juicer.  Mixed vegetables and vegan burgers.  She will eat increased amounts of potato, breads. She drinks close to one gallon a day.  She is going to the gym two times week about one hour. She is to start work next week in [place].    Her weight goals are to lose 40 lbs (no time frame).  She feels like her barrier is getting up to go to the gym.  Once she gets there she is okay.  No multivitamins. Bowel movements are not regular. No medications for her constipation.  LMP 03/27/2019  Wt Readings from Last 3 Encounters: 04/15/19 : 289 lb 3.2 oz (131.2 kg) 04/15/18 : 266 lb 12.1 oz (121 kg) 03/07/18 : 269 lb 12.8 oz (122.4 kg)     Past Medical History:  Diagnosis Date  . Migraine      Family History  Problem Relation Age of Onset  . Hypertension Mother   . Hypertension Father   . Breast cancer Maternal Grandfather      Current Outpatient Medications:  .  phentermine  37.5 MG capsule, Take 1 capsule (37.5 mg total) by mouth every morning. (Patient not taking: Reported on 04/15/2019), Disp: 30 capsule, Rfl: 0   Allergies  Allergen Reactions  . Penicillins Hives and Other (See Comments)    Has patient had a PCN reaction causing immediate rash, facial/tongue/throat swelling, SOB or lightheadedness with hypotension:unsure Has patient had a PCN reaction causing severe rash involving mucus membranes or skin necrosis:unsure Has patient had a PCN reaction that required hospitalization:unsure Has patient had a PCN reaction occurring within the last 10 years:No Childhood Reaction - rash If all of the above answers are "NO", then may proceed with Cephalosporin use.      Review of Systems  Constitutional: Negative.  Negative for fatigue.  Respiratory: Negative.   Cardiovascular: Negative.   Neurological: Negative for dizziness and headaches.  Psychiatric/Behavioral: Negative.      Today's Vitals   04/15/19 1506  BP: 124/84  Pulse: 74  Temp: 98.3 F (36.8 C)  TempSrc: Oral  Weight: 289 lb 3.2 oz (131.2 kg)  Height: 5' 8.4" (1.737 m)  PainSc: 0-No pain   Body mass index is 43.46 kg/m.   Objective:  Physical Exam Constitutional:      General: She is not in acute distress.    Appearance: Normal appearance. She is  obese.  Cardiovascular:     Rate and Rhythm: Normal rate and regular rhythm.     Pulses: Normal pulses.     Heart sounds: Normal heart sounds. No murmur.  Pulmonary:     Effort: Pulmonary effort is normal. No respiratory distress.     Breath sounds: Normal breath sounds.  Skin:    General: Skin is warm and dry.     Capillary Refill: Capillary refill takes less than 2 seconds.  Neurological:     General: No focal deficit present.     Mental Status: She is alert and oriented to person, place, and time.     Cranial Nerves: No cranial nerve deficit.  Psychiatric:        Mood and Affect: Mood normal.        Behavior: Behavior normal.         Thought Content: Thought content normal.        Judgment: Judgment normal.         Assessment And Plan:     1. Morbid obesity (HCC)  Chronic, has taken phentermine in the past for one month  Will start phentermine [name] she is aware to monitor for palpitations  Goal is to lose at least 6 lbs by her next office visit  Encouraged to increase her physical activity and to keep a food log - phentermine 15 MG capsule; Take 1 capsule (15 mg total) by mouth every morning.  Dispense: 30 capsule; Refill: 1  2. BMI 40.0-44.9, adult ([place])     Minette Brine, FNP    THE PATIENT IS ENCOURAGED TO PRACTICE SOCIAL DISTANCING DUE TO THE COVID-19 PANDEMIC.

## 2019-04-15 NOTE — Patient Instructions (Signed)
Keep a log of you food intake and exercise Cut back on breads/pastas/potatoes Goal weight loss in 2 months is 5-6 lbs.   Cardio 3 days a week and 1 day strength training goal

## 2019-05-08 ENCOUNTER — Encounter: Payer: Self-pay | Admitting: Nurse Practitioner

## 2019-05-27 DIAGNOSIS — J309 Allergic rhinitis, unspecified: Secondary | ICD-10-CM | POA: Diagnosis not present

## 2019-05-27 DIAGNOSIS — Z20822 Contact with and (suspected) exposure to covid-19: Secondary | ICD-10-CM | POA: Diagnosis not present

## 2019-05-30 DIAGNOSIS — R3915 Urgency of urination: Secondary | ICD-10-CM | POA: Diagnosis not present

## 2019-05-30 DIAGNOSIS — N898 Other specified noninflammatory disorders of vagina: Secondary | ICD-10-CM | POA: Diagnosis not present

## 2019-05-30 DIAGNOSIS — Z113 Encounter for screening for infections with a predominantly sexual mode of transmission: Secondary | ICD-10-CM | POA: Diagnosis not present

## 2019-05-30 DIAGNOSIS — N760 Acute vaginitis: Secondary | ICD-10-CM | POA: Diagnosis not present

## 2019-06-04 ENCOUNTER — Telehealth: Payer: Self-pay

## 2019-06-04 NOTE — Telephone Encounter (Signed)
Patient called stated she was calling regarding a medicine that was prescribed. I attempted to call pt and was unable to leave a v/m. YL,RMA

## 2019-06-09 NOTE — Telephone Encounter (Signed)
Returned patients call concerning decreasing her medication dosage unsure which medication needs to be decreased. Unable to LVM due to mailbox being full.

## 2019-06-10 ENCOUNTER — Encounter: Payer: Federal, State, Local not specified - PPO | Admitting: Nurse Practitioner

## 2019-07-11 DIAGNOSIS — Z130 Encounter for screening for diseases of the blood and blood-forming organs and certain disorders involving the immune mechanism: Secondary | ICD-10-CM | POA: Diagnosis not present

## 2019-07-11 DIAGNOSIS — Z6841 Body Mass Index (BMI) 40.0 and over, adult: Secondary | ICD-10-CM | POA: Diagnosis not present

## 2019-07-11 DIAGNOSIS — Z01419 Encounter for gynecological examination (general) (routine) without abnormal findings: Secondary | ICD-10-CM | POA: Diagnosis not present

## 2019-07-11 DIAGNOSIS — Z113 Encounter for screening for infections with a predominantly sexual mode of transmission: Secondary | ICD-10-CM | POA: Diagnosis not present

## 2019-07-11 DIAGNOSIS — Z124 Encounter for screening for malignant neoplasm of cervix: Secondary | ICD-10-CM | POA: Diagnosis not present

## 2019-07-11 DIAGNOSIS — Z1151 Encounter for screening for human papillomavirus (HPV): Secondary | ICD-10-CM | POA: Diagnosis not present

## 2019-07-11 DIAGNOSIS — R87612 Low grade squamous intraepithelial lesion on cytologic smear of cervix (LGSIL): Secondary | ICD-10-CM | POA: Diagnosis not present

## 2019-07-11 DIAGNOSIS — N898 Other specified noninflammatory disorders of vagina: Secondary | ICD-10-CM | POA: Diagnosis not present

## 2019-07-17 ENCOUNTER — Other Ambulatory Visit: Payer: Self-pay

## 2019-07-17 ENCOUNTER — Ambulatory Visit (INDEPENDENT_AMBULATORY_CARE_PROVIDER_SITE_OTHER): Payer: Federal, State, Local not specified - PPO | Admitting: Nurse Practitioner

## 2019-07-17 ENCOUNTER — Telehealth: Payer: Self-pay | Admitting: Nurse Practitioner

## 2019-07-17 ENCOUNTER — Encounter: Payer: Self-pay | Admitting: Nurse Practitioner

## 2019-07-17 VITALS — BP 136/84 | HR 64 | Temp 98.3°F | Ht 68.4 in | Wt 292.2 lb

## 2019-07-17 DIAGNOSIS — R0981 Nasal congestion: Secondary | ICD-10-CM

## 2019-07-17 DIAGNOSIS — Z6841 Body Mass Index (BMI) 40.0 and over, adult: Secondary | ICD-10-CM

## 2019-07-17 MED ORDER — CARBINOXAMINE MALEATE 6 MG PO TABS: 1.0000 | ORAL_TABLET | Freq: Two times a day (BID) | ORAL | 1 refills | Status: DC | PRN

## 2019-07-17 MED ORDER — PHENTERMINE HCL 37.5 MG PO CAPS: 37.5000 mg | ORAL_CAPSULE | ORAL | 1 refills | Status: DC

## 2019-07-17 MED ORDER — CARBINOXAMINE MALEATE 6 MG PO TABS: 1.0000 | ORAL_TABLET | Freq: Two times a day (BID) | ORAL | 1 refills | Status: AC | PRN

## 2019-07-17 NOTE — Telephone Encounter (Signed)
Make patient aware of her iron levels, she is to take her supplement with a stool softner two times a day and we will try to set up an iron transfusion.

## 2019-07-17 NOTE — Progress Notes (Signed)
This visit occurred during the SARS-CoV-2 public health emergency.  Safety protocols were in place, including screening questions prior to the visit, additional usage of staff PPE, and extensive cleaning of exam room while observing appropriate contact time as indicated for disinfecting solutions.  Subjective:     Patient ID: Tiffany [name] , female    DOB: [birth year] , 24 y.o.   MRN: [id number]   Chief Complaint  Patient presents with  . Weight Check    patient would like to increase her phentermine    HPI  Here for weight check.    Wt Readings from Last 3 Encounters: 07/17/19 : 292 lb 3.2 oz (132.5 kg) 04/15/19 : 289 lb 3.2 oz (131.2 kg) 04/15/18 : 266 lb 12.1 oz (121 kg)  When she started working she feel like she had been lazy.  She had started taking double the dose and ran out.  She has been without the phentermine for at least 2 months.      Past Medical History:  Diagnosis Date  . Migraine      Family History  Problem Relation Age of Onset  . Hypertension Mother   . Hypertension Father   . Breast cancer Maternal Grandfather      Current Outpatient Medications:  .  phentermine 15 MG capsule, Take 1 capsule (15 mg total) by mouth every morning. (Patient not taking: Reported on 07/16/2019), Disp: 30 capsule, Rfl: 1   Allergies  Allergen Reactions  . Penicillins Hives and Other (See Comments)    Has patient had a PCN reaction causing immediate rash, facial/tongue/throat swelling, SOB or lightheadedness with hypotension:unsure Has patient had a PCN reaction causing severe rash involving mucus membranes or skin necrosis:unsure Has patient had a PCN reaction that required hospitalization:unsure Has patient had a PCN reaction occurring within the last 10 years:No Childhood Reaction - rash If all of the above answers are "NO", then may proceed with Cephalosporin use.      Review of Systems  Constitutional: Negative.   Respiratory: Negative.    Cardiovascular: Negative.  Negative for chest pain, palpitations and leg swelling.  Neurological: Negative.  Negative for dizziness and headaches.  Psychiatric/Behavioral: Negative.      Today's Vitals   07/17/19 1617  BP: 136/84  Pulse: 64  Temp: 98.3 F (36.8 C)  TempSrc: Oral  Weight: 292 lb 3.2 oz (132.5 kg)  Height: 5' 8.4" (1.737 m)  PainSc: 0-No pain   Body mass index is 43.91 kg/m.   Objective:  Physical Exam Constitutional:      General: She is not in acute distress.    Appearance: Normal appearance.  Cardiovascular:     Rate and Rhythm: Normal rate and regular rhythm.     Pulses: Normal pulses.     Heart sounds: Normal heart sounds. No murmur heard.   Pulmonary:     Effort: Pulmonary effort is normal. No respiratory distress.     Breath sounds: Normal breath sounds.  Neurological:     General: No focal deficit present.     Mental Status: She is alert and oriented to person, place, and time.     Cranial Nerves: No cranial nerve deficit.  Psychiatric:        Mood and Affect: Mood normal.        Behavior: Behavior normal.        Thought Content: Thought content normal.        Judgment: Judgment normal.         Assessment  And Plan:      1. Morbid obesity with BMI of 40.0-44.9, adult (HCC)  Will increase her phentermine  She is advised to eat a healthy diet and increase her exercise to at least 150 minutes a week - phentermine 37.5 MG capsule; Take 1 capsule (37.5 mg total) by mouth every morning.  Dispense: 30 capsule; Refill: 1  2. Nasal congestion  Likely related to allergy symptoms  - Carbinoxamine Maleate (RYVENT) 6 MG TABS; Take 1 tablet by mouth 2 (two) times [name] as needed.  Dispense: 120 tablet; Refill: 1     Arnette Felts, FNP    THE PATIENT IS ENCOURAGED TO PRACTICE SOCIAL DISTANCING DUE TO THE COVID-19 PANDEMIC.

## 2019-08-25 ENCOUNTER — Ambulatory Visit: Payer: Federal, State, Local not specified - PPO | Admitting: Neurology

## 2019-08-25 ENCOUNTER — Other Ambulatory Visit: Payer: Self-pay

## 2019-08-25 ENCOUNTER — Encounter: Payer: Self-pay | Admitting: Neurology

## 2019-08-25 VITALS — BP 145/101 | HR 100 | Ht 68.5 in | Wt 283.0 lb

## 2019-08-25 DIAGNOSIS — G932 Benign intracranial hypertension: Secondary | ICD-10-CM | POA: Diagnosis not present

## 2019-08-25 MED ORDER — TOPIRAMATE 50 MG PO TABS: 50.0000 mg | ORAL_TABLET | Freq: Two times a day (BID) | ORAL | 3 refills | Status: DC

## 2019-08-25 NOTE — Patient Instructions (Addendum)
Follow up with: Earlean Shawl, MD [facility] [place] Follow up with Ophthalmology  Topamax 50mg  twice daily but start with one pill at bedtime and if do well over 1-2 weeks increase to twice daily  Topiramate tablets What is this medicine? TOPIRAMATE (toe PYRE a mate) is used to treat seizures in adults or children with epilepsy. It is also used for the prevention of migraine headaches. This medicine may be used for other purposes; ask your health care provider or pharmacist if you have questions. COMMON BRAND NAME(S): Topamax, Topiragen What should I tell my health care provider before I take this medicine? They need to know if you have any of these conditions:  bleeding disorders  kidney disease  lung or breathing disease, like asthma  suicidal thoughts, plans, or attempt; a previous suicide attempt by you or a family member  an unusual or allergic reaction to topiramate, other medicines, foods, dyes, or preservatives  pregnant or trying to get pregnant  breast-feeding How should I use this medicine? Take this medicine by mouth with a glass of water. Follow the directions on the prescription label. Do not cut, crush or chew this medicine. Swallow the tablets whole. You can take it with or without food. If it upsets your stomach, take it with food. Take your medicine at regular intervals. Do not take it more often than directed. Do not stop taking except on your doctor's advice. A special MedGuide will be given to you by the pharmacist with each prescription and refill. Be sure to read this information carefully each time. Talk to your pediatrician regarding the use of this medicine in children. While this drug may be prescribed for children as young as 73 years of age for selected conditions, precautions do apply. Overdosage: If you think you have taken too much of this medicine contact a poison control center or emergency room at once. NOTE: This medicine is  only for you. Do not share this medicine with others. What if I miss a dose? If you miss a dose, take it as soon as you can. If your next dose is to be taken in less than 6 hours, then do not take the missed dose. Take the next dose at your regular time. Do not take double or extra doses. What may interact with this medicine? This medicine may interact with the following medications:  acetazolamide  alcohol  antihistamines for allergy, cough, and cold  aspirin and aspirin-like medicines  atropine  birth control pills  certain medicines for anxiety or sleep  certain medicines for bladder problems like oxybutynin, tolterodine  certain medicines for depression like amitriptyline, fluoxetine, sertraline  certain medicines for seizures like carbamazepine, phenobarbital, phenytoin, primidone, valproic acid, zonisamide  certain medicines for stomach problems like dicyclomine, hyoscyamine  certain medicines for travel sickness like scopolamine  certain medicines for Parkinson's disease like benztropine, trihexyphenidyl  certain medicines that treat or prevent blood clots like warfarin, enoxaparin, dalteparin, apixaban, dabigatran, and rivaroxaban  digoxin  general anesthetics like halothane, isoflurane, methoxyflurane, propofol  hydrochlorothiazide  ipratropium  lithium  medicines that relax muscles for surgery  metformin  narcotic medicines for pain  NSAIDs, medicines for pain and inflammation, like ibuprofen or naproxen  phenothiazines like chlorpromazine, mesoridazine, prochlorperazine, thioridazine  pioglitazone This list may not describe all possible interactions. Give your health care provider a list of all the medicines, herbs, non-prescription drugs, or dietary supplements you use. Also tell them if you smoke, drink alcohol, or use illegal  drugs. Some items may interact with your medicine. What should I watch for while using this medicine? Visit your doctor or  health care professional for regular checks on your progress. Tell your health care professional if your symptoms do not start to get better or if they get worse. Do not stop taking except on your health care professional's advice. You may develop a severe reaction. Your health care professional will tell you how much medicine to take. Wear a medical ID bracelet or chain. Carry a card that describes your disease and details of your medicine and dosage times. This medicine can reduce the response of your body to heat or cold. Dress warm in cold weather and stay hydrated in hot weather. If possible, avoid extreme temperatures like saunas, hot tubs, very hot or cold showers, or activities that can cause dehydration such as vigorous exercise. Check with your health care professional if you have severe diarrhea, nausea, and vomiting, or if you sweat a lot. The loss of too much body fluid may make it dangerous for you to take this medicine. You may get drowsy or dizzy. Do not drive, use machinery, or do anything that needs mental alertness until you know how this medicine affects you. Do not stand up or sit up quickly, especially if you are an older patient. This reduces the risk of dizzy or fainting spells. Alcohol may interfere with the effect of this medicine. Avoid alcoholic drinks. Tell your health care professional right away if you have any change in your eyesight. Patients and their families should watch out for new or worsening depression or thoughts of suicide. Also watch out for sudden changes in feelings such as feeling anxious, agitated, panicky, irritable, hostile, aggressive, impulsive, severely restless, overly excited and hyperactive, or not being able to sleep. If this happens, especially at the beginning of treatment or after a change in dose, call your healthcare professional. This medicine may cause serious skin reactions. They can happen weeks to months after starting the medicine. Contact  your health care provider right away if you notice fevers or flu-like symptoms with a rash. The rash may be red or purple and then turn into blisters or peeling of the skin. Or, you might notice a red rash with swelling of the face, lips or lymph nodes in your neck or under your arms. Birth control may not work properly while you are taking this medicine. Talk to your health care professional about using an extra method of birth control. Women should inform their health care professional if they wish to become pregnant or think they might be pregnant. There is a potential for serious side effects and harm to an unborn child. Talk to your health care professional for more information. What side effects may I notice from receiving this medicine? Side effects that you should report to your doctor or health care professional as soon as possible:  allergic reactions like skin rash, itching or hives, swelling of the face, lips, or tongue  blood in the urine  changes in vision  confusion  loss of memory  pain in lower back or side  pain when urinating  redness, blistering, peeling or loosening of the skin, including inside the mouth  signs and symptoms of bleeding such as bloody or black, tarry stools; red or dark brown urine; spitting up blood or brown material that looks like coffee grounds; red spots on the skin; unusual bruising or bleeding from the eyes, gums, or nose  signs and  symptoms of increased acid in the body like breathing fast; fast heartbeat; headache; confusion; unusually weak or tired; nausea, vomiting  suicidal thoughts, mood changes  trouble speaking or understanding  unusual sweating  unusually weak or tired Side effects that usually do not require medical attention (report to your doctor or health care professional if they continue or are bothersome):  dizziness  drowsiness  fever  loss of appetite  nausea, vomiting  pain, tingling, numbness in the hands or  feet  stomach pain  tiredness  upset stomach This list may not describe all possible side effects. Call your doctor for medical advice about side effects. You may report side effects to FDA at 1-800-FDA-1088. Where should I keep my medicine? Keep out of the reach of children. Store at room temperature between 15 and 30 degrees C (59 and 86 degrees F). Throw away any unused medicine after the expiration date. NOTE: This sheet is a summary. It may not cover all possible information. If you have questions about this medicine, talk to your doctor, pharmacist, or health care provider.  2020 Elsevier/Gold Standard (2018-08-01 15:07:20)  Idiopathic Intracranial Hypertension  Idiopathic intracranial hypertension (IIH) is a condition that increases pressure around the brain. The fluid that surrounds the brain and spinal cord (cerebrospinal fluid, CSF) increases and causes the pressure. Idiopathic means that the cause of this condition is not known. IIH affects the brain and spinal cord (is a neurological disorder). If this condition is not treated, it can cause vision loss or blindness. What increases the risk? You are more likely to develop this condition if:  You are severely overweight (obese).  You are a woman who has not gone through menopause.  You take certain medicines, such as birth control or steroids. What are the signs or symptoms? Symptoms of IIH include:  Headaches. This is the most common symptom.  Pain in the shoulders or neck.  Nausea and vomiting.  A "rushing water" or pulsing sound within the ears (pulsatile tinnitus).  Double vision.  Blurred vision.  Brief episodes of complete vision loss. How is this diagnosed? This condition may be diagnosed based on:  Your symptoms.  Your medical history.  CT scan of the brain.  MRI of the brain.  Magnetic resonance venogram (MRV) to check veins in the brain.  Diagnostic lumbar puncture. This is a procedure to  remove and examine a sample of cerebrospinal fluid. This procedure can determine whether too much fluid may be causing IIH.  A thorough eye exam to check for swelling or nerve damage in the eyes. How is this treated? Treatment for this condition depends on your symptoms. The goal of treatment is to decrease the pressure around your brain. Common treatments include:  Medicines to decrease the production of spinal fluid and lower the pressure within your skull.  Medicines to prevent or treat headaches.  Surgery to place drains (shunts) in your brain to remove excess fluid.  Lumbar puncture to remove excess cerebrospinal fluid. Follow these instructions at home:  If you are overweight or obese, work with your health care provider to lose weight.  Take over-the-counter and prescription medicines only as told by your health care provider.  Do not drive or use heavy machinery while taking medicines that can make you sleepy.  Keep all follow-up visits as told by your health care provider. This is important. Contact a health care provider if:  You have changes in your vision, such as: ? Double vision. ? Not being able  to see colors (color vision). Get help right away if:  You have any of the following symptoms and they get worse or do not get better. ? Headaches. ? Nausea. ? Vomiting. ? Vision changes or difficulty seeing. Summary  Idiopathic intracranial hypertension (IIH) is a condition that increases pressure around the brain. The cause is not known (is idiopathic).  The most common symptom of IIH is headaches.  Treatment may include medicines or surgery to relieve the pressure on your brain. This information is not intended to replace advice given to you by your health care provider. Make sure you discuss any questions you have with your health care provider. Document Revised: 12/15/2016 Document Reviewed: 11/24/2015 Elsevier Patient Education  2020 [employer].

## 2019-08-25 NOTE — Progress Notes (Signed)
[facility]    Provider:  Dr Lucia GaskinsAhern Primary Care Physician:  Arnette FeltsMoore, Janece, FNP   CC:  Headaches/IIH   Interval history 08/25/2019: Patient did not follow up as directed and han't been seen in almost 2 years. She has not lost weight. She was supposed to follow up in 6 weeks and did not. She did not like the acetazolamide. She went to the weight loss clinic and did not continue. She has not seen Dr. Dione BoozeGroat either. Headaches make her nauseated, last week they were sitting above the left eye, no vision changes, she is having headaches 15 days a month, she does not wake up with them, a lot of pressure and sharp pain, no autonomic symptoms, +nausea, not positional or exertional, very similar to before but not as bad. Diamox with side effects. Recommended Lives in [place] now maybe we can find her another physician there, the vaccine.  Patient complains of symptoms per HPI as well as the following symptoms: headache, obesity, fatigue . Pertinent negatives and positives per HPI. All others negative   Interval history 11/22/2017: Opening pressure was 48, significantly elevated. MRI of the brain showed signs consistent with Intracranial Hypertension. Discussed risks of vision loss. Also discussed weight loss as a key factor for improvement. She has also seen Dr. Dione BoozeGroat. She has been taking the acetazolamide. The pressure is better. She has only been taking one a day, need to increase to twice daily. She has follow up with Dr. Dione BoozeGroat tomorrow.   This MRI of the brain with and without contrast shows the following: 1.    The pituitary gland is flattened within an enlarged sella turcica consistent with a partially empty sella turcica.  This could be incidental or due to elevated intracranial pressure.2.    There were no acute findings and there is a normal enhancement pattern.   HPI:  Varney DailyKhadijah Denise Brazeau is a 24 y.o. female here as a referral from Dr. Christell ConstantMoore for headaches. She has a PMHx  of migraines more in high school a few years ago.Headaches started a month ago, pressure behind the eyes, blurry vision, episodes of vision loss and darkness in vision, she has a fullness in her ears. Antibiotics did not help. She can wake up with headaches. Was getting them every day, sometimes she feels like she can't focus. Pressure and pulsating, she has light and sound sensitivity, nausea, movement makes it worse, a dark room helps. Headaches can last all day. Can be severe 7/10. She has gained 50 pounds. This is a different headache than her migraines. She had an IUD but removed. No long term use of antibiotics or acutane. She has diplopia, headaches are positional with hearing changes, pain on eye movement, orbital pain. Severe pain, continuous, morning on awakening. No other focal neurologic deficits, associated symptoms, inciting events or modifiable factors.  Reviewed notes, labs and imaging from outside physicians, which showed:  Reviewed referring physician notes, patient's been having headaches includes bilateral ocular symptoms not associated with menses or recent head trauma aggravating factors include bright lights and noise symptoms are not aggravated by allergies, anxiety, caffeine, exercise, head position and certain foods.  Feels better and darkness.  She is tried over-the-counter medications and analgesics.  She gets blurred vision, dizziness, nausea, personality changes and neck stiffness.  No diplopia no fever no vision loss no visual aura vertigo or vomiting.  She does feel as though this is not like her regular migraines and she is been to the emergency room.  She is on Maxalt for acute management.  She is been to the emergency room multiple times and Maxalt has been ineffective and she has had Toradol and dexamethasone injections and continues to have a headache she was last treated with a steroid Dosepak.  Hemoglobin A1c 5, TSH 0.664, LFTs are normal, creatinine 0.6.  Review of  Systems: Patient complains of symptoms per HPI as well as the following symptoms: heaache Pertinent negatives and positives per HPI. All others negative.   Social History   Socioeconomic History  . Marital status: Single    Spouse name: Not on file  . Number of children: Not on file  . Years of education: Not on file  . Highest education level: Bachelor's degree (e.g., BA, AB, BS)  Occupational History  . Not on file  Tobacco Use  . Smoking status: Current Some Day Smoker  . Smokeless tobacco: Never Used  Vaping Use  . Vaping Use: Never used  Substance and Sexual Activity  . Alcohol use: Yes    Comment: occasionally   . Drug use: No  . Sexual activity: Yes    Partners: Male    Comment: pt hasn't been taking her birth control medication  Other Topics Concern  . Not on file  Social History Narrative   Lives at home alone   Right handed   Caffeine: about 1 cup weekly, most drinks juice and water   Social Determinants of Health   Financial Resource Strain:   . Difficulty of Paying Living Expenses:   Food Insecurity:   . Worried About Programme researcher, broadcasting/film/video in the Last Year:   . Barista in the Last Year:   Transportation Needs:   . Freight forwarder (Medical):   Marland Kitchen Lack of Transportation (Non-Medical):   Physical Activity:   . Days of Exercise per Week:   . Minutes of Exercise per Session:   Stress:   . Feeling of Stress :   Social Connections:   . Frequency of Communication with Friends and Family:   . Frequency of Social Gatherings with Friends and Family:   . Attends Religious Services:   . Active Member of Clubs or Organizations:   . Attends Banker Meetings:   Marland Kitchen Marital Status:   Intimate Partner Violence:   . Fear of Current or Ex-Partner:   . Emotionally Abused:   Marland Kitchen Physically Abused:   . Sexually Abused:     Family History  Problem Relation Age of Onset  . Hypertension Mother   . Hypertension Father   . Breast cancer Maternal  Grandfather   . Headache Neg Hx     Past Medical History:  Diagnosis Date  . HPV in female   . Migraine   . Pseudotumor cerebri     Past Surgical History:  Procedure Laterality Date  . DILATION AND CURETTAGE OF UTERUS    . KNEE SURGERY Left   . lumbar puncture  2019    Current Outpatient Medications  Medication Sig Dispense Refill  . phentermine 37.5 MG capsule Take 1 capsule (37.5 mg total) by mouth every morning. 30 capsule 1  . Carbinoxamine Maleate (RYVENT) 6 MG TABS Take 1 tablet by mouth 2 (two) times daily as needed. (Patient not taking: Reported on 08/25/2019) 120 tablet 1  . topiramate (TOPAMAX) 50 MG tablet Take 1 tablet (50 mg total) by mouth 2 (two) times daily. 60 tablet 3   No current facility-administered medications for this visit.  Allergies as of 08/25/2019 - Review Complete 08/25/2019  Allergen Reaction Noted  . Penicillins Hives and Other (See Comments) 10/05/2014    Vitals: BP (!) 145/101 (BP Location: Right Arm, Patient Position: Sitting, Cuff Size: Large)   Pulse 100   Ht 5' 8.5" (1.74 m)   Wt 283 lb (128.4 kg)   BMI 42.40 kg/m  Last Weight:  Wt Readings from Last 1 Encounters:  08/25/19 283 lb (128.4 kg)   Last Height:   Ht Readings from Last 1 Encounters:  08/25/19 5' 8.5" (1.74 m)   Physical exam: Exam: Gen: NAD, conversant, well nourised, obese, well groomed                     CV: RRR, no MRG. No Carotid Bruits. No peripheral edema, warm, nontender Eyes: Conjunctivae clear without exudates or hemorrhage  Neuro: Detailed Neurologic Exam  Speech:    Speech is normal; fluent and spontaneous with normal comprehension.  Cognition:    The patient is oriented to person, place, and time;     recent and remote memory intact;     language fluent;     normal attention, concentration,     fund of knowledge Cranial Nerves:    The pupils are equal, round, and reactive to light. Slightly elevated disks without frank papilledema. Visual  fields are full to finger confrontation. Extraocular movements are intact. Trigeminal sensation is intact and the muscles of mastication are normal. The face is symmetric. The palate elevates in the midline. Hearing intact. Voice is normal. Shoulder shrug is normal. The tongue has normal motion without fasciculations.   Coordination:    Normal finger to nose and heel to shin. Normal rapid alternating movements.   Gait:    Heel-toe and tandem gait are normal.   Motor Observation:    No asymmetry, no atrophy, and no involuntary movements noted. Tone:    Normal muscle tone.    Posture:    Posture is normal. normal erect    Strength:    Strength is V/V in the upper and lower limbs.      Sensation: intact to LT     Reflex Exam:  DTR's:    Deep tendon reflexes in the upper and lower extremities are normal bilaterally.   Toes:    The toes are downgoing bilaterally.   Clonus:    Clonus is absent.      Assessment/Plan:  24 year old morbidly obese patient with intractable chronic headaches. IIH diagnosed. Opening pressure 48, MRI c/w IIH  - Opening pressure was 48, significantly elevated. Hasn't followed up since 11/2017 now lives in [place]. Did not continue diamox due to side effects will start Topamax and refer to neurology in [place] she prefers: Earlean Shawl, MD [facility] [place] - Topamax 50mg  twice daily but start with one pill at bedtime and if do well over 1-2 weeks increase to twice daily - MRI of the brain showed signs consistent with Intracranial Hypertension. - Discussed risks of vision loss. Also discussed weight loss as a key factor for improvement. She is trying, we referred her to a medical weight loss program in the past and she did not continue. - She has also seen Dr. , she can follow up with Dr. Dione Booze in [place] or ask her new neurologist in [place] to recommend an ophthalmologist - encouraged weight loss   This MRI of the brain  with and without contrast shows the following: 1.  The pituitary gland is flattened within an enlarged sella turcica consistent with a partially empty sella turcica.  This could be incidental or due to elevated intracranial pressure.2. There were no acute findings and there is a normal enhancement pattern.  - Morbid Obesity: [facility], she stopped going, encouraged her to continue - May consider a sleep eval in the future   Orders Placed This Encounter  Procedures  . Comprehensive metabolic panel  . CBC with Differential/Platelets  . TSH  . Ambulatory referral to Neurology   Meds ordered this encounter  Medications  . topiramate (TOPAMAX) 50 MG tablet    Sig: Take 1 tablet (50 mg total) by mouth 2 (two) times daily.    Dispense:  60 tablet    Refill:  3     Discussed IIH, for acute vision changes or acute worsening go to ED. Needs weight loss. Also discussed with mother on the phone.   Discussed: To prevent or relieve headaches, try the following: Cool Compress. Lie down and place a cool compress on your head.  Avoid headache triggers. If certain foods or odors seem to have triggered your migraines in the past, avoid them. A headache diary might help you identify triggers.  Include physical activity in your daily routine. Try a daily walk or other moderate aerobic exercise.  Manage stress. Find healthy ways to cope with the stressors, such as delegating tasks on your to-do list.  Practice relaxation techniques. Try deep breathing, yoga, massage and visualization.  Eat regularly. Eating regularly scheduled meals and maintaining a healthy diet might help prevent headaches. Also, drink plenty of fluids.  Follow a regular sleep schedule. Sleep deprivation might contribute to headaches Consider biofeedback. With this mind-body technique, you learn to control certain bodily functions -- such as muscle tension, heart rate and blood pressure -- to prevent headaches  or reduce headache pain.    Proceed to emergency room if you experience new or worsening symptoms or symptoms do not resolve, if you have new neurologic symptoms or if headache is severe, or for any concerning symptom.   Provided education and documentation from American headache Society toolbox including articles on: chronic migraine medication overuse headache, chronic migraines, prevention of migraines, behavioral and other nonpharmacologic treatments for headache.  Naomie Dean, MD  I spent 40 minutes of face-to-face and non-face-to-face time with patient on the  1. IIH (idiopathic intracranial hypertension)   2. Morbid obesity (HCC)    diagnosis.  This included previsit chart review, lab review, study review, order entry, electronic health record documentation, patient education on the different diagnostic and therapeutic options, counseling and coordination of care, risks and benefits of management, compliance, or risk factor reduction   [facility] [address] [place], [place] [address]  Phone [phone] Fax [phone]

## 2019-08-26 LAB — CBC WITH DIFFERENTIAL/PLATELET
Basophils Absolute: 0 10*3/uL (ref 0.0–0.2)
Basos: 1 %
EOS (ABSOLUTE): 0 10*3/uL (ref 0.0–0.4)
Eos: 0 %
Hematocrit: 36.8 % (ref 34.0–46.6)
Hemoglobin: 12.2 g/dL (ref 11.1–15.9)
Immature Grans (Abs): 0 10*3/uL (ref 0.0–0.1)
Immature Granulocytes: 0 %
Lymphocytes Absolute: 2.3 10*3/uL (ref 0.7–3.1)
Lymphs: 26 %
MCH: 30.1 pg (ref 26.6–33.0)
MCHC: 33.2 g/dL (ref 31.5–35.7)
MCV: 91 fL (ref 79–97)
Monocytes Absolute: 0.9 10*3/uL (ref 0.1–0.9)
Monocytes: 10 %
Neutrophils Absolute: 5.5 10*3/uL (ref 1.4–7.0)
Neutrophils: 63 %
Platelets: 353 10*3/uL (ref 150–450)
RBC: 4.05 x10E6/uL (ref 3.77–5.28)
RDW: 12.3 % (ref 11.7–15.4)
WBC: 8.7 10*3/uL (ref 3.4–10.8)

## 2019-08-26 LAB — COMPREHENSIVE METABOLIC PANEL
ALT: 42 IU/L — ABNORMAL HIGH (ref 0–32)
AST: 28 IU/L (ref 0–40)
Albumin/Globulin Ratio: 1.4 (ref 1.2–2.2)
Albumin: 4.4 g/dL (ref 3.9–5.0)
Alkaline Phosphatase: 86 IU/L (ref 48–121)
BUN/Creatinine Ratio: 16 (ref 9–23)
BUN: 10 mg/dL (ref 6–20)
Bilirubin Total: 0.6 mg/dL (ref 0.0–1.2)
CO2: 23 mmol/L (ref 20–29)
Calcium: 9.4 mg/dL (ref 8.7–10.2)
Chloride: 103 mmol/L (ref 96–106)
Creatinine, Ser: 0.62 mg/dL (ref 0.57–1.00)
GFR calc Af Amer: 146 mL/min/{1.73_m2} (ref >59)
GFR calc non Af Amer: 127 mL/min/{1.73_m2} (ref >59)
Globulin, Total: 3.1 g/dL (ref 1.5–4.5)
Glucose: 83 mg/dL (ref 65–99)
Potassium: 4 mmol/L (ref 3.5–5.2)
Sodium: 139 mmol/L (ref 134–144)
Total Protein: 7.5 g/dL (ref 6.0–8.5)

## 2019-08-26 LAB — TSH: TSH: 0.609 u[IU]/mL (ref 0.450–4.500)

## 2019-09-15 ENCOUNTER — Telehealth: Payer: Self-pay

## 2019-09-15 NOTE — Telephone Encounter (Signed)
Patient consented to virtual appointment. YL,RMA 

## 2019-09-17 ENCOUNTER — Telehealth: Payer: Federal, State, Local not specified - PPO | Admitting: Nurse Practitioner

## 2019-09-24 ENCOUNTER — Telehealth: Payer: Federal, State, Local not specified - PPO | Admitting: Nurse Practitioner

## 2019-09-30 DIAGNOSIS — G932 Benign intracranial hypertension: Secondary | ICD-10-CM | POA: Diagnosis not present

## 2019-09-30 DIAGNOSIS — J301 Allergic rhinitis due to pollen: Secondary | ICD-10-CM | POA: Diagnosis not present

## 2019-09-30 DIAGNOSIS — R030 Elevated blood-pressure reading, without diagnosis of hypertension: Secondary | ICD-10-CM | POA: Diagnosis not present

## 2019-10-14 DIAGNOSIS — J309 Allergic rhinitis, unspecified: Secondary | ICD-10-CM | POA: Diagnosis not present

## 2019-10-14 DIAGNOSIS — Z20822 Contact with and (suspected) exposure to covid-19: Secondary | ICD-10-CM | POA: Diagnosis not present

## 2019-10-16 DIAGNOSIS — U071 COVID-19: Secondary | ICD-10-CM | POA: Diagnosis not present

## 2019-10-25 DIAGNOSIS — Z20822 Contact with and (suspected) exposure to covid-19: Secondary | ICD-10-CM | POA: Diagnosis not present

## 2019-10-25 DIAGNOSIS — Z1152 Encounter for screening for COVID-19: Secondary | ICD-10-CM | POA: Diagnosis not present

## 2019-11-11 DIAGNOSIS — G932 Benign intracranial hypertension: Secondary | ICD-10-CM | POA: Diagnosis not present

## 2019-11-23 ENCOUNTER — Other Ambulatory Visit: Payer: Self-pay | Admitting: Neurology

## 2019-11-23 ENCOUNTER — Telehealth: Payer: Self-pay | Admitting: Neurology

## 2019-11-23 DIAGNOSIS — G932 Benign intracranial hypertension: Secondary | ICD-10-CM

## 2019-11-23 NOTE — Telephone Encounter (Addendum)
Tiffany [name], Patient moved to [place] and looks like patient had an appointment in October with another neurology center(11/11/2019 Dr. Alycia Rossetti Gleason) for her iih?   Since she is going to be seeing them then she should have them refill medications.  In  the meantime I will put in 1 refill of Topiramate and let her know any future refills should be sent to her new neurologist. Wish her luck in [place]. .  thanks

## 2019-11-24 NOTE — Telephone Encounter (Signed)
I spoke with the pt and passed along Dr Trevor Mace message to her. Pt stated she had already seen her new doctor and they were prescribing her medication. I let her know that Dr Lucia Gaskins had sent in a refill of Topiramate 50 mg BID to [facility] on [address] which is what she was already on. I let her know of course to follow the instructions of her new neurologist if they are making changes but that Dr Lucia Gaskins did provide another 3 month refill. She verbalized understanding and appreciation for the call.

## 2019-11-25 ENCOUNTER — Other Ambulatory Visit: Payer: Self-pay

## 2019-11-25 ENCOUNTER — Ambulatory Visit (INDEPENDENT_AMBULATORY_CARE_PROVIDER_SITE_OTHER): Payer: Federal, State, Local not specified - PPO | Admitting: Nurse Practitioner

## 2019-11-25 ENCOUNTER — Encounter: Payer: Self-pay | Admitting: Nurse Practitioner

## 2019-11-25 VITALS — BP 148/96 | HR 95 | Temp 98.1°F | Ht 67.8 in | Wt 284.0 lb

## 2019-11-25 DIAGNOSIS — G932 Benign intracranial hypertension: Secondary | ICD-10-CM

## 2019-11-25 DIAGNOSIS — R748 Abnormal levels of other serum enzymes: Secondary | ICD-10-CM | POA: Diagnosis not present

## 2019-11-25 DIAGNOSIS — R030 Elevated blood-pressure reading, without diagnosis of hypertension: Secondary | ICD-10-CM

## 2019-11-25 MED ORDER — AMLODIPINE BESYLATE 5 MG PO TABS: 5.0000 mg | ORAL_TABLET | Freq: Every day | ORAL | 2 refills | Status: AC

## 2019-11-25 MED ORDER — MAGNESIUM 250 MG PO TABS: 1.0000 | ORAL_TABLET | Freq: Every evening | ORAL | 2 refills | Status: AC

## 2019-11-25 NOTE — Progress Notes (Signed)
I,Tiffany [name] as a Neurosurgeon for [employer], FNP.,have documented all relevant documentation on the behalf of Tiffany Felts, FNP,as directed by  Tiffany Felts, FNP while in the presence of Tiffany Felts, FNP. This visit occurred during the SARS-CoV-2 public health emergency.  Safety protocols were in place, including screening questions prior to the visit, additional usage of staff PPE, and extensive cleaning of exam room while observing appropriate contact time as indicated for disinfecting solutions.  Subjective:     Patient ID: Tiffany [name] , female    DOB: July 01, 1995 , 24 y.o.   MRN: [id number]   Chief Complaint  Patient presents with  . elevated blood pressure    patient stated her blood pressure has been elevated anf she is getitng ready to start a new job. she was told she needs to get cleared before starting on monday.    HPI  Here due to her blood pressure being elevated blood pressure. She went for prescreening and had an elevated blood pressure was 153/113, 168/12, 163/114.  Over the last few months she has had an elevated blood pressure at GYN, Neurology. She has just relocated to [place] in March - university area.  She reports she has pseudotumor cerebri, was reported she has swelling.  She had covid in September and her headaches have returned. She is taking topiramate.  Mother and father have hypertension. She does admit to eating a lot of fast food.   146/98    Past Medical History:  Diagnosis Date  . HPV in female   . Migraine   . Pseudotumor cerebri      Family History  Problem Relation Age of Onset  . Hypertension Mother   . Hypertension Father   . Breast cancer Maternal Grandfather   . Headache Neg Hx      Current Outpatient Medications:  .  topiramate (TOPAMAX) 50 MG tablet, TAKE 1 TABLET BY MOUTH TWICE A DAY, Disp: 180 tablet, Rfl: 0 .  amLODipine (NORVASC) 5 MG tablet, Take 1 tablet (5 mg total) by mouth [name], Disp: 30 tablet,  Rfl: 2 .  Carbinoxamine Maleate (RYVENT) 6 MG TABS, Take 1 tablet by mouth 2 (two) times [name] as needed. (Patient not taking: Reported on 08/25/2019), Disp: 120 tablet, Rfl: 1 .  Magnesium 250 MG TABS, Take 1 tablet (250 mg total) by mouth every evening. Take with evening meal, Disp: 30 tablet, Rfl: 2   Allergies  Allergen Reactions  . Penicillins Hives and Other (See Comments)    Has patient had a PCN reaction causing immediate rash, facial/tongue/throat swelling, SOB or lightheadedness with hypotension:unsure Has patient had a PCN reaction causing severe rash involving mucus membranes or skin necrosis:unsure Has patient had a PCN reaction that required hospitalization:unsure Has patient had a PCN reaction occurring within the last 10 years:No Childhood Reaction - rash If all of the above answers are "NO", then may proceed with Cephalosporin use.      Review of Systems  Constitutional: Negative.   Respiratory: Negative.   Cardiovascular: Negative.  Negative for chest pain, palpitations and leg swelling.  Neurological: Positive for headaches. Negative for dizziness.  Psychiatric/Behavioral: Negative.      Today's Vitals   11/25/19 1646 11/25/19 1657  BP: (!) 140/100 (!) 148/96  Pulse: 95   Temp: 98.1 F (36.7 C)   TempSrc: Oral   Weight: 284 lb (128.8 kg)   Height: 5' 7.8" (1.722 m)   PainSc: 0-No pain    Body mass index is  43.44 kg/m.   Objective:  Physical Exam Vitals reviewed.  Constitutional:      General: She is not in acute distress.    Appearance: Normal appearance. She is well-developed. She is obese.  HENT:     Head: Normocephalic and atraumatic.  Eyes:     Pupils: Pupils are equal, round, and reactive to light.  Cardiovascular:     Rate and Rhythm: Normal rate and regular rhythm.     Pulses: Normal pulses.     Heart sounds: Normal heart sounds. No murmur heard.   Pulmonary:     Effort: Pulmonary effort is normal. No respiratory distress.     Breath  sounds: Normal breath sounds. No wheezing.  Musculoskeletal:        General: Normal range of motion.  Skin:    General: Skin is warm and dry.     Capillary Refill: Capillary refill takes less than 2 seconds.  Neurological:     General: No focal deficit present.     Mental Status: She is alert and oriented to person, place, and time.     Cranial Nerves: No cranial nerve deficit.  Psychiatric:        Mood and Affect: Mood normal.        Behavior: Behavior normal.        Thought Content: Thought content normal.        Judgment: Judgment normal.         Assessment And Plan:     1. IIH (idiopathic intracranial hypertension)  She is seeing a neurologist for this and is taking topiramate  2. Elevated blood-pressure reading, without diagnosis of hypertension I will start her on low dose amlodipine, she is to take magnesium [name] as well I will try to see if there is a provider she can see in [facility] on low salt diet explained EKG done NSR HR 90 - amLODipine (NORVASC) 5 MG tablet; Take 1 tablet (5 mg total) by mouth [name]  Dispense: 30 tablet; Refill: 2 - Magnesium 250 MG TABS; Take 1 tablet (250 mg total) by mouth every evening. Take with evening meal  Dispense: 30 tablet; Refill: 2 - EKG 12-Lead  3. Elevated liver enzymes  She has elevated liver enzymes previously will recheck today  Encouraged to avoid drinking alcohol - Liver Profile     Patient was given opportunity to ask questions. Patient verbalized understanding of the plan and was able to repeat key elements of the plan. All questions were answered to their satisfaction.    Tiffany Sparrow, FNP, have reviewed all documentation for this visit. The documentation on 12/16/19 for the exam, diagnosis, procedures, and orders are all accurate and complete.  THE PATIENT IS ENCOURAGED TO PRACTICE SOCIAL DISTANCING DUE TO THE COVID-19 PANDEMIC.

## 2019-11-26 LAB — HEPATIC FUNCTION PANEL
ALT: 27 IU/L (ref 0–32)
AST: 23 IU/L (ref 0–40)
Albumin: 4.2 g/dL (ref 3.9–5.0)
Alkaline Phosphatase: 91 IU/L (ref 44–121)
Bilirubin Total: 0.4 mg/dL (ref 0.0–1.2)
Bilirubin, Direct: 0.13 mg/dL (ref 0.00–0.40)
Total Protein: 7.7 g/dL (ref 6.0–8.5)

## 2019-12-02 DIAGNOSIS — N760 Acute vaginitis: Secondary | ICD-10-CM | POA: Diagnosis not present

## 2019-12-02 DIAGNOSIS — J309 Allergic rhinitis, unspecified: Secondary | ICD-10-CM | POA: Diagnosis not present

## 2019-12-02 DIAGNOSIS — U070 Vaping-related disorder: Secondary | ICD-10-CM | POA: Diagnosis not present

## 2019-12-02 DIAGNOSIS — Z1389 Encounter for screening for other disorder: Secondary | ICD-10-CM | POA: Diagnosis not present

## 2019-12-09 ENCOUNTER — Ambulatory Visit: Payer: Federal, State, Local not specified - PPO

## 2019-12-16 ENCOUNTER — Ambulatory Visit: Payer: Self-pay

## 2019-12-22 DIAGNOSIS — Z20822 Contact with and (suspected) exposure to covid-19: Secondary | ICD-10-CM | POA: Diagnosis not present

## 2019-12-22 DIAGNOSIS — Z1152 Encounter for screening for COVID-19: Secondary | ICD-10-CM | POA: Diagnosis not present

## 2019-12-22 DIAGNOSIS — O219 Vomiting of pregnancy, unspecified: Secondary | ICD-10-CM | POA: Diagnosis not present

## 2019-12-22 DIAGNOSIS — Z3201 Encounter for pregnancy test, result positive: Secondary | ICD-10-CM | POA: Diagnosis not present

## 2020-01-06 ENCOUNTER — Ambulatory Visit: Payer: Federal, State, Local not specified - PPO | Admitting: Nurse Practitioner

## 2020-01-16 DIAGNOSIS — N760 Acute vaginitis: Secondary | ICD-10-CM | POA: Diagnosis not present

## 2020-01-16 DIAGNOSIS — J309 Allergic rhinitis, unspecified: Secondary | ICD-10-CM | POA: Diagnosis not present

## 2020-01-16 DIAGNOSIS — I10 Essential (primary) hypertension: Secondary | ICD-10-CM | POA: Diagnosis not present

## 2020-01-16 DIAGNOSIS — U071 COVID-19: Secondary | ICD-10-CM | POA: Diagnosis not present

## 2020-02-22 ENCOUNTER — Other Ambulatory Visit: Payer: Self-pay | Admitting: Nurse Practitioner

## 2020-02-22 DIAGNOSIS — R030 Elevated blood-pressure reading, without diagnosis of hypertension: Secondary | ICD-10-CM

## 2020-04-21 DIAGNOSIS — Z113 Encounter for screening for infections with a predominantly sexual mode of transmission: Secondary | ICD-10-CM | POA: Diagnosis not present

## 2020-04-21 DIAGNOSIS — N760 Acute vaginitis: Secondary | ICD-10-CM | POA: Diagnosis not present

## 2020-06-06 DIAGNOSIS — Z1152 Encounter for screening for COVID-19: Secondary | ICD-10-CM | POA: Diagnosis not present

## 2020-06-06 DIAGNOSIS — Z20822 Contact with and (suspected) exposure to covid-19: Secondary | ICD-10-CM | POA: Diagnosis not present

## 2020-06-30 DIAGNOSIS — R030 Elevated blood-pressure reading, without diagnosis of hypertension: Secondary | ICD-10-CM | POA: Diagnosis not present

## 2020-06-30 DIAGNOSIS — J019 Acute sinusitis, unspecified: Secondary | ICD-10-CM | POA: Diagnosis not present

## 2020-06-30 DIAGNOSIS — J029 Acute pharyngitis, unspecified: Secondary | ICD-10-CM | POA: Diagnosis not present

## 2020-08-04 ENCOUNTER — Encounter: Payer: Self-pay | Admitting: Nurse Practitioner

## 2020-08-04 DIAGNOSIS — R319 Hematuria, unspecified: Secondary | ICD-10-CM | POA: Diagnosis not present

## 2020-08-04 DIAGNOSIS — Z130 Encounter for screening for diseases of the blood and blood-forming organs and certain disorders involving the immune mechanism: Secondary | ICD-10-CM | POA: Diagnosis not present

## 2020-08-04 DIAGNOSIS — Z202 Contact with and (suspected) exposure to infections with a predominantly sexual mode of transmission: Secondary | ICD-10-CM | POA: Diagnosis not present

## 2020-08-04 DIAGNOSIS — Z1151 Encounter for screening for human papillomavirus (HPV): Secondary | ICD-10-CM | POA: Diagnosis not present

## 2020-08-04 DIAGNOSIS — E669 Obesity, unspecified: Secondary | ICD-10-CM | POA: Diagnosis not present

## 2020-08-04 DIAGNOSIS — Z124 Encounter for screening for malignant neoplasm of cervix: Secondary | ICD-10-CM | POA: Diagnosis not present

## 2020-08-04 DIAGNOSIS — B009 Herpesviral infection, unspecified: Secondary | ICD-10-CM | POA: Diagnosis not present

## 2020-08-04 DIAGNOSIS — N898 Other specified noninflammatory disorders of vagina: Secondary | ICD-10-CM | POA: Diagnosis not present

## 2020-08-04 DIAGNOSIS — Z01419 Encounter for gynecological examination (general) (routine) without abnormal findings: Secondary | ICD-10-CM | POA: Diagnosis not present

## 2020-08-19 DIAGNOSIS — I10 Essential (primary) hypertension: Secondary | ICD-10-CM | POA: Diagnosis not present

## 2020-08-19 DIAGNOSIS — J309 Allergic rhinitis, unspecified: Secondary | ICD-10-CM | POA: Diagnosis not present

## 2020-08-19 DIAGNOSIS — J020 Streptococcal pharyngitis: Secondary | ICD-10-CM | POA: Diagnosis not present

## 2020-08-19 DIAGNOSIS — Z20822 Contact with and (suspected) exposure to covid-19: Secondary | ICD-10-CM | POA: Diagnosis not present

## 2020-09-09 DIAGNOSIS — N764 Abscess of vulva: Secondary | ICD-10-CM | POA: Diagnosis not present

## 2020-09-29 DIAGNOSIS — J020 Streptococcal pharyngitis: Secondary | ICD-10-CM | POA: Diagnosis not present

## 2020-09-29 DIAGNOSIS — Z20822 Contact with and (suspected) exposure to covid-19: Secondary | ICD-10-CM | POA: Diagnosis not present

## 2020-09-29 DIAGNOSIS — J309 Allergic rhinitis, unspecified: Secondary | ICD-10-CM | POA: Diagnosis not present

## 2020-09-29 DIAGNOSIS — I10 Essential (primary) hypertension: Secondary | ICD-10-CM | POA: Diagnosis not present

## 2020-11-01 DIAGNOSIS — Z6841 Body Mass Index (BMI) 40.0 and over, adult: Secondary | ICD-10-CM | POA: Diagnosis not present

## 2020-11-01 DIAGNOSIS — I10 Essential (primary) hypertension: Secondary | ICD-10-CM | POA: Diagnosis not present

## 2020-11-01 DIAGNOSIS — Z0000 Encounter for general adult medical examination without abnormal findings: Secondary | ICD-10-CM | POA: Diagnosis not present

## 2020-11-01 DIAGNOSIS — A6004 Herpesviral vulvovaginitis: Secondary | ICD-10-CM | POA: Diagnosis not present

## 2020-11-02 DIAGNOSIS — R87612 Low grade squamous intraepithelial lesion on cytologic smear of cervix (LGSIL): Secondary | ICD-10-CM | POA: Diagnosis not present

## 2020-11-02 DIAGNOSIS — N870 Mild cervical dysplasia: Secondary | ICD-10-CM | POA: Diagnosis not present

## 2020-11-18 DIAGNOSIS — N898 Other specified noninflammatory disorders of vagina: Secondary | ICD-10-CM | POA: Diagnosis not present

## 2020-11-18 DIAGNOSIS — N946 Dysmenorrhea, unspecified: Secondary | ICD-10-CM | POA: Diagnosis not present

## 2020-12-02 DIAGNOSIS — I10 Essential (primary) hypertension: Secondary | ICD-10-CM | POA: Diagnosis not present

## 2020-12-15 DIAGNOSIS — Z20822 Contact with and (suspected) exposure to covid-19: Secondary | ICD-10-CM | POA: Diagnosis not present

## 2020-12-15 DIAGNOSIS — I10 Essential (primary) hypertension: Secondary | ICD-10-CM | POA: Diagnosis not present

## 2020-12-15 DIAGNOSIS — J309 Allergic rhinitis, unspecified: Secondary | ICD-10-CM | POA: Diagnosis not present

## 2020-12-15 DIAGNOSIS — J039 Acute tonsillitis, unspecified: Secondary | ICD-10-CM | POA: Diagnosis not present

## 2021-01-06 DIAGNOSIS — I10 Essential (primary) hypertension: Secondary | ICD-10-CM | POA: Diagnosis not present

## 2021-01-06 DIAGNOSIS — N946 Dysmenorrhea, unspecified: Secondary | ICD-10-CM | POA: Diagnosis not present
